# Patient Record
Sex: Male | Born: 1960
Health system: Southern US, Community
[De-identification: ages and names within clinical notes are randomized; demographics above are authoritative.]

## PROBLEM LIST (undated history)

## (undated) DIAGNOSIS — K219 Gastro-esophageal reflux disease without esophagitis: Secondary | ICD-10-CM

## (undated) DIAGNOSIS — Z87442 Personal history of urinary calculi: Secondary | ICD-10-CM

## (undated) DIAGNOSIS — F329 Major depressive disorder, single episode, unspecified: Secondary | ICD-10-CM

## (undated) DIAGNOSIS — F32A Depression, unspecified: Secondary | ICD-10-CM

---

## 2002-10-18 ENCOUNTER — Ambulatory Visit (HOSPITAL_COMMUNITY): Admission: RE | Admit: 2002-10-18 | Discharge: 2002-10-18 | Payer: Self-pay | Admitting: Family Medicine

## 2002-10-18 ENCOUNTER — Encounter: Payer: Self-pay | Admitting: Family Medicine

## 2007-08-24 ENCOUNTER — Ambulatory Visit (HOSPITAL_COMMUNITY): Admission: RE | Admit: 2007-08-24 | Discharge: 2007-08-24 | Payer: Self-pay | Admitting: Family Medicine

## 2008-07-14 HISTORY — PX: KNEE SURGERY: SHX244

## 2009-01-05 ENCOUNTER — Ambulatory Visit (HOSPITAL_COMMUNITY): Admission: RE | Admit: 2009-01-05 | Discharge: 2009-01-05 | Payer: Self-pay | Admitting: Family Medicine

## 2009-01-07 ENCOUNTER — Emergency Department (HOSPITAL_COMMUNITY): Admission: EM | Admit: 2009-01-07 | Discharge: 2009-01-07 | Payer: Self-pay | Admitting: Emergency Medicine

## 2009-01-08 ENCOUNTER — Ambulatory Visit: Payer: Self-pay | Admitting: Internal Medicine

## 2009-01-08 ENCOUNTER — Encounter: Payer: Self-pay | Admitting: Internal Medicine

## 2009-01-08 ENCOUNTER — Observation Stay (HOSPITAL_COMMUNITY): Admission: AD | Admit: 2009-01-08 | Discharge: 2009-01-11 | Payer: Self-pay | Admitting: Family Medicine

## 2009-01-08 ENCOUNTER — Telehealth (INDEPENDENT_AMBULATORY_CARE_PROVIDER_SITE_OTHER): Payer: Self-pay | Admitting: *Deleted

## 2009-01-09 ENCOUNTER — Ambulatory Visit: Payer: Self-pay | Admitting: Gastroenterology

## 2009-01-10 ENCOUNTER — Ambulatory Visit: Payer: Self-pay | Admitting: Internal Medicine

## 2009-01-19 ENCOUNTER — Encounter: Payer: Self-pay | Admitting: Internal Medicine

## 2010-05-28 ENCOUNTER — Ambulatory Visit: Payer: Self-pay | Admitting: Orthopedic Surgery

## 2010-05-28 DIAGNOSIS — M25569 Pain in unspecified knee: Secondary | ICD-10-CM | POA: Insufficient documentation

## 2010-05-28 DIAGNOSIS — M23329 Other meniscus derangements, posterior horn of medial meniscus, unspecified knee: Secondary | ICD-10-CM | POA: Insufficient documentation

## 2010-05-29 ENCOUNTER — Encounter: Payer: Self-pay | Admitting: Orthopedic Surgery

## 2010-05-31 ENCOUNTER — Encounter: Payer: Self-pay | Admitting: Orthopedic Surgery

## 2010-06-04 ENCOUNTER — Ambulatory Visit (HOSPITAL_COMMUNITY)
Admission: RE | Admit: 2010-06-04 | Discharge: 2010-06-04 | Payer: Self-pay | Source: Home / Self Care | Admitting: Orthopedic Surgery

## 2010-06-05 ENCOUNTER — Encounter: Payer: Self-pay | Admitting: Orthopedic Surgery

## 2010-06-18 ENCOUNTER — Ambulatory Visit: Payer: Self-pay | Admitting: Orthopedic Surgery

## 2010-06-18 DIAGNOSIS — M2350 Chronic instability of knee, unspecified knee: Secondary | ICD-10-CM | POA: Insufficient documentation

## 2010-06-18 DIAGNOSIS — M1710 Unilateral primary osteoarthritis, unspecified knee: Secondary | ICD-10-CM | POA: Insufficient documentation

## 2010-06-18 DIAGNOSIS — M2340 Loose body in knee, unspecified knee: Secondary | ICD-10-CM | POA: Insufficient documentation

## 2010-06-18 DIAGNOSIS — IMO0002 Reserved for concepts with insufficient information to code with codable children: Secondary | ICD-10-CM

## 2010-06-21 ENCOUNTER — Encounter (INDEPENDENT_AMBULATORY_CARE_PROVIDER_SITE_OTHER): Payer: Self-pay | Admitting: *Deleted

## 2010-06-26 ENCOUNTER — Telehealth: Payer: Self-pay | Admitting: Orthopedic Surgery

## 2010-07-01 ENCOUNTER — Ambulatory Visit (HOSPITAL_COMMUNITY)
Admission: RE | Admit: 2010-07-01 | Discharge: 2010-07-01 | Payer: Self-pay | Source: Home / Self Care | Attending: Orthopedic Surgery | Admitting: Orthopedic Surgery

## 2010-07-01 ENCOUNTER — Encounter: Payer: Self-pay | Admitting: Orthopedic Surgery

## 2010-07-03 ENCOUNTER — Ambulatory Visit: Payer: Self-pay | Admitting: Orthopedic Surgery

## 2010-07-03 ENCOUNTER — Telehealth: Payer: Self-pay | Admitting: Orthopedic Surgery

## 2010-07-03 DIAGNOSIS — Z9889 Other specified postprocedural states: Secondary | ICD-10-CM | POA: Insufficient documentation

## 2010-07-04 ENCOUNTER — Encounter (HOSPITAL_COMMUNITY)
Admission: RE | Admit: 2010-07-04 | Discharge: 2010-08-03 | Payer: Self-pay | Source: Home / Self Care | Attending: Orthopedic Surgery | Admitting: Orthopedic Surgery

## 2010-07-17 ENCOUNTER — Telehealth: Payer: Self-pay | Admitting: Orthopedic Surgery

## 2010-07-18 ENCOUNTER — Encounter: Payer: Self-pay | Admitting: Orthopedic Surgery

## 2010-07-24 ENCOUNTER — Ambulatory Visit
Admission: RE | Admit: 2010-07-24 | Discharge: 2010-07-24 | Payer: Self-pay | Source: Home / Self Care | Attending: Orthopedic Surgery | Admitting: Orthopedic Surgery

## 2010-07-24 ENCOUNTER — Encounter (INDEPENDENT_AMBULATORY_CARE_PROVIDER_SITE_OTHER): Payer: Self-pay | Admitting: *Deleted

## 2010-07-26 ENCOUNTER — Encounter: Payer: Self-pay | Admitting: Orthopedic Surgery

## 2010-08-13 NOTE — Assessment & Plan Note (Signed)
Summary: RT KNEE PAIN/NEEDS XRAY/BCBS/CAF   Vital Signs:  Patient profile:   50 year old male Height:      72 inches Weight:      208 pounds Pulse rate:   82 / minute Resp:     18 per minute  Vitals Entered By: Fuller Canada MD (May 28, 2010 2:14 PM)  Visit Type:  new patient Referring Serge Main:  self Primary Parks Czajkowski:  Dr. Lubertha South  CC:  right knee pain.  History of Present Illness: I saw Samuel Henry in the office today for an initial visit.  He is a 50 years old man with the complaint JY:NWGNF knee pain.  The patient was going up a step or stair and fell, acute onset of pain on the medial side of his knee associated with a burning sensation and since that time. He has sharp, dull, throbbing pain on the medial side of his RIGHT knee associated with a popping sensation in discomfort depending on which way in which position. His knees in. There are subtle signs of giving out. There is increased stiffness and aching with activity. He also has a lot of discomfort after sitting for a long time.  No injury.  Xrays today.  Meds: Bupropion sr, Omeprazole, Xanax.      Allergies (verified): 1)  ! Nsaids  Past History:  Past Medical History: acid reflux anxiety seasonal allergies  Past Surgical History: na  Family History: Family History Coronary Heart Disease male < 58  Social History: Patient is married.  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  Review of Systems Gastrointestinal:  Complains of heartburn; denies nausea, vomiting, diarrhea, constipation, and blood in your stools. Psychiatric:  Complains of anxiety; denies nervousness, depression, and hallucinations. Immunology:  Complains of seasonal allergies; denies sinus problems and allergic to bee stings.  The review of systems is negative for Constitutional, Cardiovascular, Respiratory, Genitourinary, Neurologic, Musculoskeletal, Endocrine, Skin, HEENT, and  Hemoatologic.  Physical Exam  Skin:  intact without lesions or rashes Psych:  alert and cooperative; normal mood and affect; normal attention span and concentration   Knee Exam  General:    Well-developed, well-nourished, normal body habitus; no deformities, normal grooming.  Gait:    Normal heel-toe gait pattern bilaterally.    Skin:    Intact, no scars, lesions, rashes, cafe au lait spots, or bruising.    Inspection:     No deformity, ecchymosis or swelling.   Palpation:      tenderness R-medial joint line.    Vascular:    There was no swelling or varicose veins. The pulses and temperature are normal. There was no edema or tenderness.  Sensory:    Gross coordination and sensation were normal.    Motor:    Motor strength 5/5 bilaterally for quadriceps, hamstrings, ankle dorsiflexion, and ankle plantar flexion.    Reflexes:    Normal and symmetric patellar and Achilles reflexes bilaterally.    Knee Exam:    Right:    Inspection:  Abnormal    Palpation:  Abnormal    Stability:  stable    Range of Motion:       Flexion-Active: full       Extension-Active: full    Left:    Inspection:  Normal    Palpation:  Normal    Range of Motion:       Flexion-Active: full       Extension-Active: full  Special tests:    McMurrays negative  Patellar mobility:    Right normal    Left normal Anterior drawer:    Right negative; Left negative Posterior drawer:    Right negative; Left negative Lachman :    Right negative; Left negative MCL:    Right negative; Left negative LCL:    Right negative; Left negative   Impression & Recommendations: 3 views of the RIGHT knee, show that there is a loose body in the center of the joint just behind the patellar fat pad. There is, otherwise only minimal degenerative changes with equal joint spaces. Some mild spurring at the superior pole of patella.  Recommend MRI to define and quantify the loose body and evaluate the medial  meniscus for possible tear MRI indications pain, for 6 months. Medial knee with a burning sensation with initial injury and popping. Loose body.  Other Orders: New Patient Level III ([address]) Knee x-ray,  3 views (60454)  Patient Instructions: 1)  We will call you with the results after the MRI   Orders Added: 1)  New Patient Level III [09811] 2)  Knee x-ray,  3 views [73562]

## 2010-08-13 NOTE — Miscellaneous (Signed)
Summary: MRI [facility] right knee 06/07/10 reg 830am  Clinical Lists Changes    no precert needed for [employer] PPO, LMOM for patient about appt, DR TO CALL WITH RESULTS.

## 2010-08-13 NOTE — Letter (Signed)
Summary: surgery order Rt knee sched 07/01/10  surgery order Rt knee sched 07/01/10   Imported By: Cammie Sickle 06/18/2010 18:41:11  _____________________________________________________________________  External Attachment:    Type:   Image     Comment:   External Document

## 2010-08-13 NOTE — Miscellaneous (Signed)
  probably  old acl   also has med men tear and med lateral arthritis  with loose fragment v  rec surgery arthroscopy   Clinical Lists Changes

## 2010-08-13 NOTE — Letter (Signed)
Summary: History form  History form   Imported By: Jacklynn Ganong 05/29/2010 08:40:21  _____________________________________________________________________  External Attachment:    Type:   Image     Comment:   External Document

## 2010-08-13 NOTE — Letter (Signed)
Summary: Out of Work  [facility]  [address]  [place], [place] [address]   Phone: [phone]  Fax: [phone]    June 18, 2010   Employee:  Samuel Henry    To Whom It May Concern:   For Medical reasons, please excuse the above named employee from work for the following dates:  Start:   07/01/10  Estimated end date:   07/17/10, or until further notice   If you need additional information, please feel free to contact our office.         Sincerely,    Terrance Mass, MD

## 2010-08-13 NOTE — Assessment & Plan Note (Signed)
Summary: to schedule surgery rt knee.[email or web address]   Visit Type:  Follow-up Referring Provider:  self Primary Provider:  Dr. Lubertha South  CC:  right knee pain.  History of Present Illness: I saw Samuel Henry in the office today for a visit.  He is a 50 years old man with the complaint NU:UVOZD knee pain.  The patient was going up a step or stair and fell, acute onset of pain on the medial side of his knee associated with a burning sensation and since that time. He has sharp, dull, throbbing pain on the medial side of his RIGHT knee associated with a popping sensation in discomfort depending on which way in which position. His knees in. There are subtle signs of giving out. There is increased stiffness and aching with activity. He also has a lot of discomfort after sitting for a long time.  Meds: Bupropion sr, Omeprazole, Xanax, Etodolac 400mg  as needed.  he will be scheduled for surgery today.         Allergies: 1)  ! Nsaids  Past History:  Past Medical History: Last updated: 05/28/2010 acid reflux anxiety seasonal allergies  Past Surgical History: Last updated: 05/28/2010 na  Family History: Last updated: 05/28/2010 Family History Coronary Heart Disease male < 67  Social History: Last updated: 05/28/2010 Patient is married.  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  Review of Systems      See HPI Constitutional:  Denies weight loss, weight gain, fever, chills, and fatigue. Cardiovascular:  Denies chest pain, palpitations, fainting, and murmurs. Respiratory:  Denies short of breath, wheezing, couch, tightness, pain on inspiration, and snoring . Gastrointestinal:  Denies heartburn, nausea, vomiting, diarrhea, constipation, and blood in your stools. Genitourinary:  Denies frequency, urgency, difficulty urinating, painful urination, flank pain, and bleeding in urine. Neurologic:  Denies numbness, tingling, unsteady gait, dizziness,  tremors, and seizure. Musculoskeletal:  See HPI. Endocrine:  Denies excessive thirst, exessive urination, and heat or cold intolerance. Psychiatric:  Denies nervousness, depression, anxiety, and hallucinations. Skin:  Denies changes in the skin, poor healing, rash, itching, and redness. HEENT:  Denies blurred or double vision, eye pain, redness, and watering. Immunology:  Denies seasonal allergies, sinus problems, and allergic to bee stings. Hemoatologic:  Denies easy bleeding and brusing.   Impression & Recommendations:  Problem # 1:  DERANGEMENT OF POSTERIOR HORN OF MEDIAL MENISCUS (ICD-717.2)  that Samuel Henry has decided to go ahead with surgical treatment.  Based on his history we think he had an old ACL injury and now has an acute medial meniscal injury with associated loose body which may be older  We will proceed with surgical treatment.  Risks and benefits have been discussed  The MRI showed the following  IMPRESSION: 1.  Small oblique grade 3 tear of the posterior horn of the medial meniscus extends to the inferior meniscal surface. 2.  Attenuated but not totally torn anterior cruciate ligament suggesting partial or interstitial tear. 3.  Mild pes anserine bursitis and trace knee effusion.   4.  Degenerative chondral thinning along the medial patellar facet with several subcortical lesions noted. 5.  Suspected free osteochondral fragment in the knee joint. 6.  Degenerative chondral findings in the medial and lateral compartments.  Orders: Est. Patient Level IV (66440)  Problem # 2:  LOOSE BODY-KNEE (ICD-717.6)  arthroscopy RIGHT knee partial medial meniscectomy, removal of loose body, debridement  Orders: Est. Patient Level IV (34742)  Problem # 3:  OLD DISRUPTION OF ANTERIOR  CRUCIATE LIGAMENT (ICD-717.83)  Orders: Est. Patient Level IV (57846)  Problem # 4:  ARTHRITIS, RIGHT KNEE (ICD-716.96)  Orders: Est. Patient Level IV (96295)  Patient  Instructions: 1)  DOS 07/01/10 2)  Post op 1 in our office on  07/03/10 3)  right knee. 4)  Arthroscopy right knee partial medial menisectomy  5)  I will call you today and let you know when to go to [facility] short stay for your preop, take packet with you.   Orders Added: 1)  Est. Patient Level IV [28413]

## 2010-08-13 NOTE — Miscellaneous (Signed)
Summary: No prior auth required for out-patient procedure  Clinical Lists Changes  Contacted insurer [employer], re: out-patient surgery schedule at [facility] 07/01/10, CPT [phone], [phone].  Per Gerilyn Nestle, no pre-auth required, her name as reference.

## 2010-08-15 NOTE — Miscellaneous (Signed)
Summary: PT clinical evaluation  PT clinical evaluation   Imported By: Jacklynn Ganong 07/18/2010 15:43:24  _____________________________________________________________________  External Attachment:    Type:   Image     Comment:   External Document

## 2010-08-15 NOTE — Letter (Signed)
Summary: [employer] disability form  [employer] disability form   Imported By: Cammie Sickle 08/01/2010 12:41:49  _____________________________________________________________________  External Attachment:    Type:   Image     Comment:   External Document

## 2010-08-15 NOTE — Letter (Signed)
Summary: Out of Work  [facility]  [address]  [place], [place] [address]   Phone: [phone]  Fax: [phone]    July 03, 2010   Employee:  Samuel Henry    To Whom It May Concern:   For Medical reasons, please excuse the above named employee from work for the following dates:  Start:   07/03/2010  Estimated end date:   07/24/2010 or until further notice   If you need additional information, please feel free to contact our office.         Sincerely,    Lenda Kelp  MD

## 2010-08-15 NOTE — Assessment & Plan Note (Signed)
Summary: surg rt knee 07-01-10/post op 1/bcbs/wkj   Visit Type:  Follow-up Referring Provider:  self Primary Provider:  Dr. Lubertha South  CC:  post op 1 right knee.  History of Present Illness: I saw Samuel Henry in the office today for a followup visit.  He is a 50 years old man with the complaint of:  post op 1 right knee arthroscopy, PMM, removal of loose body, chondroplasty medial femoral condyle.  DOS 07/01/10.  POD 2.    Meds: Norco 7.5 number 84 with 1 refill given.  W. today for postop check, doing well. Knee flexion, 90, portal sites look good. He does have some contact dermatitis from Xeroform. He is advised is allergic to that now.  Continue with physical therapy weightbearing as tolerated, crutches can be weaned as tolerated. Followup in 3 weeks  Allergies: 1)  ! Nsaids   Impression & Recommendations:  Problem # 1:  OLD DISRUPTION OF ANTERIOR CRUCIATE LIGAMENT (ICD-717.83)  Orders: Post-Op Check (09811)  Problem # 2:  LOOSE BODY-KNEE (ICD-717.6)  Orders: Post-Op Check (91478)  Problem # 3:  DERANGEMENT OF POSTERIOR HORN OF MEDIAL MENISCUS (ICD-717.2)  Orders: Post-Op Check (29562)  Problem # 4:  ARTHROSCOPY, RIGHT KNEE, HX OF (ICD-V45.89)  Orders: Post-Op Check (13086)  Patient Instructions: 1)  come back in 3 weeks   Orders Added: 1)  Post-Op Check [57846]

## 2010-08-15 NOTE — Progress Notes (Signed)
Summary: can he do exercises at home?  Phone Note Call from Patient   Summary of Call: Samuel Henry has completed 4 PT sessions at [facility].  He has found out that his insurance is only paying for sessions before 07/14/10 without starting over with a new deductible for this year.  He said the therapist has given him home exercises. Is it OK for him to complete his program at home?  Has an appointment with you next Wednesday. His # [phone] Initial call taken by: Jacklynn Ganong,  July 17, 2010 12:18 PM  Follow-up for Phone Call        yes  Follow-up by: Fuller Canada MD,  July 17, 2010 1:15 PM  Additional Follow-up for Phone Call Additional follow up Details #1::        Patient advised

## 2010-08-15 NOTE — Miscellaneous (Signed)
  Physical Exam  Skin:  intact without lesions or rashes Psych:  alert and cooperative; normal mood and affect; normal attention span and concentration   Knee Exam  General:    Well-developed, well-nourished, normal body habitus; no deformities, normal grooming.  Gait:    Normal heel-toe gait pattern bilaterally.    Skin:    Intact, no scars, lesions, rashes, cafe au lait spots, or bruising.    Inspection:     No deformity, ecchymosis or swelling.   Palpation:      tenderness R-medial joint line.    Vascular:    There was no swelling or varicose veins. The pulses and temperature are normal. There was no edema or tenderness.  Sensory:    Gross coordination and sensation were normal.    Motor:    Motor strength 5/5 bilaterally for quadriceps, hamstrings, ankle dorsiflexion, and ankle plantar flexion.    Reflexes:    Normal and symmetric patellar and Achilles reflexes bilaterally.    Knee Exam:    Right:    Inspection:  Abnormal    Palpation:  Abnormal    Stability:  stable    Range of Motion:       Flexion-Active: full       Extension-Active: full    Left:    Inspection:  Normal    Palpation:  Normal    Range of Motion:       Flexion-Active: full       Extension-Active: full  Special tests:    McMurrays negative   Patellar mobility:    Right normal    Left normal Anterior drawer:    Right negative; Left negative Posterior drawer:    Right negative; Left negative Lachman :    Right negative; Left negative MCL:    Right negative; Left negative LCL:    Right negative; Left negative   Impression & Recommendations: 3 views of the RIGHT knee, show that there is a loose body in the center of the joint just behind the patellar fat pad. There is, otherwise only minimal degenerative changes with equal joint spaces. Some mild spurring at the superior pole of patella.

## 2010-08-15 NOTE — Progress Notes (Signed)
Summary: patient asking about possible prescription  Phone Note Call from Patient   Caller: Patient Summary of Call: Patient called right back fol'g visit. Asking if something can be prescribed for constipation; said has not had a BM since surgery 07/01/10. celll [phone] Pharmacy is [facility] in [place] Initial call taken by: Cammie Sickle,  July 03, 2010 2:40 PM  Follow-up for Phone Call        magnesium citrate 30 cc q 6 hrs until bm   colace 100 mg two times a day # 60  Follow-up by: Fuller Canada MD,  July 03, 2010 2:44 PM  Additional Follow-up for Phone Call Additional follow up Details #1::        advised patient Additional Follow-up by: Ether Griffins,  July 03, 2010 3:51 PM

## 2010-08-15 NOTE — Progress Notes (Signed)
Summary: Call from [facility] day surgery w/results  Phone Note Other Incoming   Caller: Day surgery [facility] Summary of Call: French Ana from Day Surgery called [phone]) called to notify tested positive for MSSA and they are treating for staph w/Mupirocin. Initial call taken by: Cammie Sickle,  June 26, 2010 3:12 PM

## 2010-08-15 NOTE — Letter (Signed)
Summary: Out of Work  [facility]  [address]  [place], [place] [address]   Phone: [phone]  Fax: [phone]    July 24, 2010   Employee:  LAWSEN ARNOTT    To Whom It May Concern:   For Medical reasons, please excuse the above named employee from work for the following dates:  Start:   07/01/2010  End:/ 08/05/2010 can return to work for duty no restrictions     If you need additional information, please feel free to contact our office.         Sincerely,    Dr. Rudean Hitt, MD

## 2010-08-15 NOTE — Letter (Signed)
Summary: FMLA form [employer]  External Other   Imported By: Eugenio Hoes 07/17/2010 12:25:06  _____________________________________________________________________  External Attachment:    Type:   Image     Comment:   External Document

## 2010-08-15 NOTE — Assessment & Plan Note (Signed)
Summary: 3 WK SURG RT KNEE 07/01/10/ POST OP 2/BCBS/[employer]   Visit Type:  Follow-up Referring Provider:  self Primary Provider:  Dr. Lubertha South  CC:  post op rt knee.  History of Present Illness: I saw Samuel Henry in the office today for a followup visit.  He is a 50 years old man with the complaint of:  post op 2 right knee arthroscopy, PMM, removal of loose body, chondroplasty medial femoral condyle.  DOS 07/01/10.  Meds: Norco 7.5 not needed.  Today is 3 week followup after PT.  Doing better, still some aching after alot of activity.  Discharged from PT.  He is doing very well. His ACL tests clinically are normal. He did seem to have a partial ACL tear and arthroscopy. He does have some arthritis. His medial symptoms have resolved. He has excellent knee flexion, slight flexion contracture, which I think was noted. Preop.  He can go back to work on January 23 followup as needed      Allergies: 1)  ! Nsaids   Impression & Recommendations:  Problem # 1:  ARTHROSCOPY, RIGHT KNEE, HX OF (ICD-V45.89)  Orders: Post-Op Check ([address])  Problem # 2:  AFTERCARE FOLLOW SURGERY MUSCULOSKEL SYSTEM NEC (ICD-V58.78)  Orders: Post-Op Check (60454)  Problem # 3:  ARTHRITIS, RIGHT KNEE (ICD-716.96)  Orders: Post-Op Check (09811)  Problem # 4:  OLD DISRUPTION OF ANTERIOR CRUCIATE LIGAMENT (ICD-717.83)  Orders: Post-Op Check (91478)  Problem # 5:  LOOSE BODY-KNEE (ICD-717.6)  Orders: Post-Op Check (29562)  Problem # 6:  DERANGEMENT OF POSTERIOR HORN OF MEDIAL MENISCUS (ICD-717.2)  Orders: Post-Op Check (13086)  Patient Instructions: 1)  23rd Jan return to work  2)  f/u as needed    Orders Added: 1)  Post-Op Check [57846]

## 2010-09-24 LAB — BASIC METABOLIC PANEL
GFR calc Af Amer: >60 mL/min (ref >60)
GFR calc non Af Amer: >60 mL/min (ref >60)
Glucose, Bld: 88 mg/dL (ref 70–99)
Potassium: 3.9 mEq/L (ref 3.5–5.1)
Sodium: 141 mEq/L (ref 135–145)

## 2010-09-24 LAB — CBC
HCT: 39.8 % (ref 39.0–52.0)
Hemoglobin: 13.7 g/dL (ref 13.0–17.0)
MCHC: 34.4 g/dL (ref 30.0–36.0)
RBC: 4.67 MIL/uL (ref 4.22–5.81)
WBC: 5.4 10*3/uL (ref 4.0–10.5)

## 2010-09-24 LAB — SURGICAL PCR SCREEN: MRSA, PCR: NEGATIVE

## 2010-10-20 LAB — CBC
HCT: 31.8 % — ABNORMAL LOW (ref 39.0–52.0)
Hemoglobin: 11.5 g/dL — ABNORMAL LOW (ref 13.0–17.0)
RBC: 3.71 MIL/uL — ABNORMAL LOW (ref 4.22–5.81)
WBC: 6.3 10*3/uL (ref 4.0–10.5)

## 2010-10-20 LAB — DIFFERENTIAL
Eosinophils Relative: 1 % (ref 0–5)
Lymphocytes Relative: 15 % (ref 12–46)
Lymphs Abs: 0.9 10*3/uL (ref 0.7–4.0)
Monocytes Absolute: 0.5 10*3/uL (ref 0.1–1.0)
Monocytes Relative: 8 % (ref 3–12)

## 2010-10-21 LAB — DIFFERENTIAL
Basophils Absolute: 0 10*3/uL (ref 0.0–0.1)
Basophils Relative: 0 % (ref 0–1)
Basophils Relative: 0 % (ref 0–1)
Eosinophils Absolute: 0 10*3/uL (ref 0.0–0.7)
Eosinophils Absolute: 0 10*3/uL (ref 0.0–0.7)
Eosinophils Absolute: 0.1 10*3/uL (ref 0.0–0.7)
Eosinophils Relative: 0 % (ref 0–5)
Eosinophils Relative: 0 % (ref 0–5)
Lymphocytes Relative: 10 % — ABNORMAL LOW (ref 12–46)
Lymphs Abs: 0.7 10*3/uL (ref 0.7–4.0)
Lymphs Abs: 0.7 10*3/uL (ref 0.7–4.0)
Monocytes Absolute: 0.8 10*3/uL (ref 0.1–1.0)
Monocytes Relative: 11 % (ref 3–12)
Neutro Abs: 4.6 10*3/uL (ref 1.7–7.7)
Neutrophils Relative %: 77 % (ref 43–77)
Neutrophils Relative %: 78 % — ABNORMAL HIGH (ref 43–77)

## 2010-10-21 LAB — BASIC METABOLIC PANEL
BUN: 21 mg/dL (ref 6–23)
BUN: 5 mg/dL — ABNORMAL LOW (ref 6–23)
CO2: 26 mEq/L (ref 19–32)
CO2: 26 mEq/L (ref 19–32)
Calcium: 8.2 mg/dL — ABNORMAL LOW (ref 8.4–10.5)
Chloride: 105 mEq/L (ref 96–112)
Chloride: 109 mEq/L (ref 96–112)
Creatinine, Ser: 0.95 mg/dL (ref 0.4–1.5)
Creatinine, Ser: 0.97 mg/dL (ref 0.4–1.5)
Glucose, Bld: 89 mg/dL (ref 70–99)
Potassium: 3.7 mEq/L (ref 3.5–5.1)

## 2010-10-21 LAB — URINE MICROSCOPIC-ADD ON

## 2010-10-21 LAB — COMPREHENSIVE METABOLIC PANEL
ALT: 37 U/L (ref 0–53)
AST: 58 U/L — ABNORMAL HIGH (ref 0–37)
Albumin: 3.8 g/dL (ref 3.5–5.2)
CO2: 27 mEq/L (ref 19–32)
Calcium: 9 mg/dL (ref 8.4–10.5)
Creatinine, Ser: 1.18 mg/dL (ref 0.4–1.5)
GFR calc Af Amer: >60 mL/min (ref >60)
GFR calc non Af Amer: >60 mL/min (ref >60)
Sodium: 136 mEq/L (ref 135–145)

## 2010-10-21 LAB — CBC
HCT: 35.8 % — ABNORMAL LOW (ref 39.0–52.0)
MCHC: 35.6 g/dL (ref 30.0–36.0)
MCHC: 35.8 g/dL (ref 30.0–36.0)
MCHC: 36 g/dL (ref 30.0–36.0)
MCV: 86.1 fL (ref 78.0–100.0)
MCV: 86.1 fL (ref 78.0–100.0)
Platelets: 108 10*3/uL — ABNORMAL LOW (ref 150–400)
Platelets: 111 10*3/uL — ABNORMAL LOW (ref 150–400)
Platelets: 122 10*3/uL — ABNORMAL LOW (ref 150–400)
RBC: 4.8 MIL/uL (ref 4.22–5.81)
RDW: 12.4 % (ref 11.5–15.5)
WBC: 5.4 10*3/uL (ref 4.0–10.5)
WBC: 7.5 10*3/uL (ref 4.0–10.5)

## 2010-10-21 LAB — LIPASE, BLOOD: Lipase: 28 U/L (ref 11–59)

## 2010-10-21 LAB — HEPATIC FUNCTION PANEL
Albumin: 3.3 g/dL — ABNORMAL LOW (ref 3.5–5.2)
Alkaline Phosphatase: 69 U/L (ref 39–117)
Indirect Bilirubin: 0.8 mg/dL (ref 0.3–0.9)
Total Protein: 6.2 g/dL (ref 6.0–8.3)

## 2010-10-21 LAB — URINALYSIS, ROUTINE W REFLEX MICROSCOPIC
Glucose, UA: NEGATIVE mg/dL
Ketones, ur: 40 mg/dL — AB
Leukocytes, UA: NEGATIVE
Specific Gravity, Urine: 1.025 (ref 1.005–1.030)
pH: 6 (ref 5.0–8.0)

## 2010-10-21 LAB — KOH PREP: KOH Prep: NONE SEEN

## 2010-11-26 NOTE — Group Therapy Note (Signed)
NAMEHOSEY, BURMESTER                  ACCOUNT NO.:  [id number]   MEDICAL RECORD NO.:  [id number]          PATIENT TYPE:  INP   LOCATION:  A323                          FACILITY:  [facility]   PHYSICIAN:  W. Stephen Luking, M.D.DATE OF BIRTH:  1960-08-30   DATE OF PROCEDURE:  DATE OF DISCHARGE:                                 PROGRESS NOTE   SUBJECTIVE:  The patient states able to keep liquids down had been  easier, still having burning when he swallows, urinated well this  morning, less pain though still present, less nausea though still  present.   PHYSICAL EXAMINATION:  VITAL SIGNS:  Stable.  LUNGS:  Clear.  HEART:  Regular rate and rhythm.  ABDOMEN:  Minimal epigastric tenderness.   LABS:  CBC from admission, platelet count borderline well over 104,000,  hemoglobin and white blood count good, MET 7 good.   IMPRESSION:  1. Severe esophagitis, etiology unclear.  See prior notes.  2. Thrombocytopenia.   PLAN:  1. We will press on with Lovenox since over 100,000 and no active      bleeding.  2. Decrease IV fluids.  3. Maintain clear liquids.  4. Further recommendations based on Dr. Luvenia Starch results of biopsy.      Donna Bernard, M.D.  Electronically Signed     WSL/MEDQ  D:  01/09/2009  T:  01/10/2009  Job:  [id number]

## 2010-11-26 NOTE — Consult Note (Signed)
NAMEAYSEN, SHIEH                  ACCOUNT NO.:  [id number]   MEDICAL RECORD NO.:  [id number]          PATIENT TYPE:  OBV   LOCATION:  A323                          FACILITY:  [facility]   PHYSICIAN:  R. Roetta Sessions, M.D. DATE OF BIRTH:  05-25-1961   DATE OF CONSULTATION:  01/08/2009  DATE OF DISCHARGE:                                 CONSULTATION   REASON FOR CONSULTATION:  Crescendo odynophagia and dysphagia.   HISTORY OF PRESENT ILLNESS:  Mr. Laker Thompson. [place] is a very pleasant 61-  year-old Caucasian male who gives a long history (at least 10 years) of  typical gastroesophageal reflux disease he describes as heartburn, who  has had well-controlled symptoms on intermittent OTC H2 blocker therapy.   Approximately 5 days ago, when he was eating out at a [employer],  he consumed some beef and noted significant difficulties getting it to  go down.  He felt that the meat was getting caught behind his breast  bone.  He has had significant symptoms trying to get food to go down  ever since.  Also he has also had significant amount of pain associated  with swallowing.  Has been able to get down liquids fairly well but has  significant difficulty with solid food.   He did have symptoms to the point of trying to self-induce vomiting to  relieve what he perceived to be an obstruction without much improvement  in his symptoms.  Earlier last week he did take an aspirin powder for  headache.   He also was noted to have a mild leukocytosis and fever when he was seen  by Dr. Gerda Diss.  It is notable he was started on doxycycline over this  past weekend but this is at least 3 days after the onset the above-  mentioned symptoms.  He does not consume alcohol.  He does not use  tobacco products.  He gives a history of vague intermittent esophageal  dysphagia over several years for which she has not sought out  evaluation.  Has never had an EGD and has really not had any abdominal  pain and denies  melena, constipation, diarrhea or hematochezia.  There  is no family history of GI neoplasia.   He was seen in the ED over this past weekend at which time he underwent  laboratory evaluation which included a lipase which was normal at 28.  Amylase slightly elevated at 141.  CBC on June 27 and was 7.5, H and H  14.7 and 41.2.  Today's white count 5.4, H and H 12.9 and 35.8.  He was  noted to have a small amount of blood and protein in his urine on  January 27.  Had many bacteria, three to six RBCs and 0-2 white cells on  microscopic evaluation.  Chest x-ray on June 25 was read as being  normal.   PAST MEDICAL HISTORY:  Significant for anxiety, neurosis/depression,  gastroesophageal reflux disease.  He denies any prior surgeries.   MEDICATIONS ON ADMISSION:  1. Xanax 0.5 mg p.r.n.  2. Ranitidine HCl.  3. Prevacid.  4. Doxycycline.   ALLERGIES:  No known drug allergies.   FAMILY HISTORY:  Negative for GI or liver illness.   SOCIAL HISTORY:  Patient married.  He has 2 children grown in her 41s.  He lost one daughter in a motor vehicle accident.  No tobacco, no  alcohol.  Works as a Curator at [employer]  He lives in [place] with  his wife.   REVIEW OF SYSTEMS:  No dyspnea or chest pain on exertion.  He tells me  has lost 10 pounds over the past week because of the inability to eat.  Otherwise as in history of present illness.   PHYSICAL EXAMINATION:  CONSTITUTIONAL:  Reveals a very pleasant 50-year-  old gentleman resting comfortably.  HEENT:  Exam no scleral icterus.  Conjunctivae pink.  CHEST:  Lungs are clear to auscultation.  CARDIOVASCULAR:  Regular rate and rhythm without murmur, rub.  ABDOMEN:  Nondistended.  Positive bowel sounds, soft, entirely nontender  without appreciable mass or hepatosplenomegaly.  EXTREMITIES:  No edema.   LABORATORY EVALUATION:  Thus far as outlined above.   IMPRESSION:  Mr. Dino is a pleasant 50 year old gentleman with  longstanding  typical gastroesophageal reflux disease and vague  intermittent chronic symptoms of esophageal dysphagia who over the past  5 days has developed acutely worsening symptoms of esophageal dysphagia  as well as odynophagia.  His symptoms have progressed to the point where  to where he is now admitted for further evaluation of these symptoms.   At this point the differential diagnosis would include erosive reflux  esophagitis with stricture exacerbated by recent aspirin powder use.  Alternatively, the patient could have eosinophilic esophagitis with a  partial food impaction as well.   Pill induced esophageal injury in the setting of GERD with peptic  stricture or eosinophilic esophagitis could go along with the present  clinical picture.  With recent fever and leukocytosis, an infectious  process such as candida or herpes esophagitis could also present  similarly superimposed on gastroesophageal reflux disease although  certainly fever and leukocytosis could well be a separate issue.   He did appear to have some dirty urine.   I doubt that doxycycline precipitated this acute illness as the onset of  symptoms were again 3 days predating the first doxycycline intake,  although certainly doxycycline could be a contributing factor to ongoing  symptoms if he has an esophageal obstruction.   RECOMMENDATIONS:  Mr. Meunier needs an EGD as soon as can be arranged.  Risks, benefits and limitations to this approach have been reviewed.  This is the way ago.  This will give [place] much more information barium  pill esophagram at this time.  I told Mr. Trulson I may or may not be able  to dilate his esophagus today depending on what is found.  Potential for  esophageal brushings and biopsies also reviewed.  He is agreeable.  Plan  to perform EGD a little later this afternoon and then we will render  further recommendations.   I would thank Dr. Lubertha South for allowing me to see this nice  gentleman today.   Further recommendations to follow in the dictation.      Jonathon Bellows, M.D.  Electronically Signed     RMR/MEDQ  D:  01/08/2009  T:  01/08/2009  Job:  [id number]   cc:   Donna Bernard, M.D.  Fax: [phone]

## 2010-11-26 NOTE — H&P (Signed)
Samuel Henry, Samuel Henry                  ACCOUNT NO.:  [id number]   MEDICAL RECORD NO.:  [id number]          PATIENT TYPE:  OBV   LOCATION:  A323                          FACILITY:  [facility]   PHYSICIAN:  Donna Bernard, M.D.DATE OF BIRTH:  1961/03/05   DATE OF ADMISSION:  01/08/2009  DATE OF DISCHARGE:  LH                              HISTORY & PHYSICAL   CHIEF COMPLAINT:  Vomiting, chest pain, abdominal pain.   SUBJECTIVE:  This patient is a 50 year old white male with history of  chronic reflux, insomnia, remote history of kidney stones who presented  to the office mid last week with substernal burning.  He stated this  came on after having had some pot roast several days before he had a  sense it caught in his chest.  He has never seen in the office, had  substernal burning, and had several episodes of vomiting.  He had pretty  impressive temperature, had 102 temperature.  He was sent for lab work.  He had a white blood count mildly elevated at 13,000.  MET-7 was good.  Abdominal exam was completely benign.  He had a chest x-ray which was  negative.  The patient was advised to follow up the next day with Dr.  Milinda Cave in the office if he is still having problems.  He did not do  this.  By Sunday, his chest burning had worsened and he was vomiting  morning he had a distinct sensation that food and even drink was  catching as he tried to swallow.  He continued to have low-grade fever  intermittently.  He also had some congestion and drainage.  The patient  notes no headache.  He did have mild diffuse achiness.  No back pain.  He was given doxycycline in the office due to fever and achiness, low-  grade fever.   PAST MEDICAL HISTORY:  He has had prior medical history of kidney stones  remotely, intermittent hives.  He has had chronic reflux.  He uses  p.r.n. medication for this.   CURRENT MEDICATIONS:  1. Ranitidine 150 mg p.o. b.i.d. p.r.n.  2. Wellbutrin 150 SR one p.o. b.i.d.  3.  Xanax 0.5 mg on as needed basis.  4. Viagra 50 mg p.r.n.   PAST HOSPITALIZATIONS:  Way back in 1983, he had gastroenteritis,  dehydration.   FAMILY HISTORY:  Positive for diabetes, prostate cancer.   ALLERGIES:  None known.   SOCIAL HISTORY:  The patient works in maintenance.  He is married.  Does  have children.  No tobacco or alcohol use.   REVIEW OF SYSTEMS:  Otherwise negative.   PHYSICAL EXAMINATION:  VITAL SIGNS:  Temperature 98.1, BP 124/78.  GENERAL:  The patient is alert, in moderate distress.  HEENT: Mucous membranes are somewhat dry.  NECK:  Supple.  No lymphadenopathy.  LUNGS: Clear.  HEART:  Regular rate and rhythm.  Some epigastric tenderness.  No CVA  tenderness.  EXTREMITIES:  Normal.   SIGNIFICANT LABS:  CBC, MET-7, liver enzymes are pending.  Urinalysis  from before was negative for nitrites and leukocyte  esterase, also had  40 ketones   IMPRESSION:  Esophagitis, question mechanical obstruction such as a  Schatzki ring with history of chronic reflux and initiation with eating  meat.  The patient states intermittent solid food dysphagia in recent  months, febrile illness, throws things off a bit.  Advised family this  could be a couple of separate issues, although statistically would be in  better shape if we could pull it all under one diagnosis.   PLAN:  IV hydration, IV pain medicine, urgent blood work, GI consult.  I  spoke with Dr. Jena Gauss for doing upper endoscopy.  Further orders as noted  in the chart.      Donna Bernard, M.D.  Electronically Signed     WSL/MEDQ  D:  01/08/2009  T:  01/09/2009  Job:  [id number]

## 2010-11-26 NOTE — Op Note (Signed)
NAMESANDER, Samuel Henry                  ACCOUNT NO.:  [id number]   MEDICAL RECORD NO.:  [id number]          PATIENT TYPE:  OBV   LOCATION:  A323                          FACILITY:  [facility]   PHYSICIAN:  R. Roetta Sessions, M.D. DATE OF BIRTH:  1961/06/07   DATE OF PROCEDURE:  01/08/2009  DATE OF DISCHARGE:                               OPERATIVE REPORT   PROCEDURE:  Esophagogastroduodenoscopy with esophageal biopsy and  brushing.   INDICATIONS FOR PROCEDURE:  A 50 year old gentleman with chronic  gastroesophageal reflux disease now with a 1-week history of crescendo  odynophagia and dysphagia.  He also has had low grade fever and  leukocytosis.  EGD is now being done to further evaluate his symptoms.  Risks, benefits, alternatives and limitations have been reviewed and  questions answered.  Please see documentation in the medical record.   PROCEDURE NOTE:  O2 saturation, blood pressure, pulse respirations were  monitored throughout the entirety of procedure.   ANESTHESIA:  1. Conscious sedation with Versed 6 mg IV and Demerol 150 mg IV in      divided doses.  2. Cetacaine spray for topical pharyngeal anesthesia.   INSTRUMENT:  Pentax video chip system.   FINDINGS:  Examination of the tubular esophagus revealed markedly  abnormal mucosa with denuding of the esophageal mucosa diffusely with  islands of creamy yellow exudate, but not raised plaques that would  typically be seen with Candida.  There was no significant ulceration.  The esophagus was diffusely markedly inflamed.  There was a noncritical  Schatzki's ring.  The EG junction was easily traversed.  Stomach:  The  gastric cavity was emptied and insufflated well with air.  Thorough  examination of the gastric mucosa including retroflexion of the proximal  stomach and esophagogastric junction demonstrated a moderate size hiatal  hernia and multiple 1-2 mm fundal gland polyps which were not  manipulated.  The pylorus was patent,  easily traversed.  Examination of  the bulb and the second portion revealed no abnormalities.  Therapeutic/diagnostic maneuvers performed:  The esophageal mucosa was  brushed for KOH and subsequently histology.  Biopsies for histology were  taken.  This was done without difficulty with good patient tolerance.  The patient was reactive.   ENDOSCOPY IMPRESSION:  Markedly inflamed esophageal mucosa diffusely  with denuding of the mucosa and some creamy, yellowish fixed exudate,  atypical for Candida esophagitis.   The process suggests severe esophagitis status post biopsy and brushing,  noncritical Schatzki's ring, non-manipulative moderate size hiatal  hernia, fundal gland polyps, otherwise normal stomach, D1-D2.   The patient has severe esophagitis.  I would favor an infectious  etiology over a diffuse chemical (or medication) esophageal injury  although the two entities could theoretically co-exist.   RECOMMENDATIONS:  1. Continue acid suppression therapy as you are doing.  2. We will add Carafate 1 gram slurries q.i.d.  3. Put a rush on the path and the KOH prep.  4. Further recommendations to follow in the very near future.      Jonathon Bellows, M.D.  Electronically Signed  RMR/MEDQ  D:  01/08/2009  T:  01/08/2009  Job:  [id number]   cc:   Lesli Albee  Fax: [phone]

## 2011-02-03 ENCOUNTER — Emergency Department (HOSPITAL_COMMUNITY)
Admission: EM | Admit: 2011-02-03 | Discharge: 2011-02-03 | Disposition: A | Discharge status: Home / Self Care | Payer: BC Managed Care – PPO | Attending: Emergency Medicine | Admitting: Emergency Medicine

## 2011-02-03 ENCOUNTER — Emergency Department (HOSPITAL_COMMUNITY): Payer: BC Managed Care – PPO

## 2011-02-03 ENCOUNTER — Encounter: Payer: Self-pay | Admitting: Emergency Medicine

## 2011-02-03 DIAGNOSIS — F3289 Other specified depressive episodes: Secondary | ICD-10-CM | POA: Insufficient documentation

## 2011-02-03 DIAGNOSIS — Y92410 Unspecified street and highway as the place of occurrence of the external cause: Secondary | ICD-10-CM | POA: Insufficient documentation

## 2011-02-03 DIAGNOSIS — T148XXA Other injury of unspecified body region, initial encounter: Secondary | ICD-10-CM

## 2011-02-03 DIAGNOSIS — IMO0002 Reserved for concepts with insufficient information to code with codable children: Secondary | ICD-10-CM | POA: Insufficient documentation

## 2011-02-03 DIAGNOSIS — S20219A Contusion of unspecified front wall of thorax, initial encounter: Secondary | ICD-10-CM | POA: Insufficient documentation

## 2011-02-03 DIAGNOSIS — K219 Gastro-esophageal reflux disease without esophagitis: Secondary | ICD-10-CM | POA: Insufficient documentation

## 2011-02-03 DIAGNOSIS — F329 Major depressive disorder, single episode, unspecified: Secondary | ICD-10-CM | POA: Insufficient documentation

## 2011-02-03 HISTORY — DX: Major depressive disorder, single episode, unspecified: F32.9

## 2011-02-03 HISTORY — DX: Gastro-esophageal reflux disease without esophagitis: K21.9

## 2011-02-03 HISTORY — DX: Depression, unspecified: F32.A

## 2011-02-03 MED ORDER — IOHEXOL 300 MG/ML  SOLN
100.0000 mL | Freq: Once | INTRAMUSCULAR | Status: AC | PRN
  Administered 2011-02-03: 100 mL via INTRAVENOUS

## 2011-02-03 MED ORDER — ONDANSETRON HCL 4 MG/2ML IJ SOLN
4.0000 mg | Freq: Once | INTRAMUSCULAR | Status: AC
  Administered 2011-02-03: 4 mg via INTRAVENOUS
  Filled 2011-02-03: qty 2

## 2011-02-03 MED ORDER — FENTANYL CITRATE 0.05 MG/ML IJ SOLN
50.0000 ug | Freq: Once | INTRAMUSCULAR | Status: AC
  Administered 2011-02-03: 100 ug via INTRAVENOUS
  Filled 2011-02-03: qty 2

## 2011-02-03 MED ORDER — OXYCODONE-ACETAMINOPHEN 5-325 MG PO TABS: 2.0000 | ORAL_TABLET | Freq: Four times a day (QID) | ORAL | Status: AC | PRN

## 2011-02-03 MED ORDER — TETANUS-DIPHTH-ACELL PERTUSSIS 5-2.5-18.5 LF-MCG/0.5 IM SUSP
0.5000 mL | Freq: Once | INTRAMUSCULAR | Status: AC
  Administered 2011-02-03: 0.5 mL via INTRAMUSCULAR
  Filled 2011-02-03: qty 0.5

## 2011-02-03 MED ORDER — HYDROMORPHONE HCL 1 MG/ML IJ SOLN
1.0000 mg | Freq: Once | INTRAMUSCULAR | Status: AC
  Administered 2011-02-03: 1 mg via INTRAVENOUS
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
## 2011-02-06 ENCOUNTER — Encounter (HOSPITAL_COMMUNITY): Payer: Self-pay | Admitting: *Deleted

## 2011-02-06 ENCOUNTER — Emergency Department (HOSPITAL_COMMUNITY)
Admission: EM | Admit: 2011-02-06 | Discharge: 2011-02-06 | Disposition: A | Discharge status: Home / Self Care | Payer: BC Managed Care – PPO | Attending: Emergency Medicine | Admitting: Emergency Medicine

## 2011-02-06 ENCOUNTER — Emergency Department (HOSPITAL_COMMUNITY): Payer: BC Managed Care – PPO

## 2011-02-06 DIAGNOSIS — N200 Calculus of kidney: Secondary | ICD-10-CM | POA: Insufficient documentation

## 2011-02-06 DIAGNOSIS — K219 Gastro-esophageal reflux disease without esophagitis: Secondary | ICD-10-CM | POA: Insufficient documentation

## 2011-02-06 DIAGNOSIS — F3289 Other specified depressive episodes: Secondary | ICD-10-CM | POA: Insufficient documentation

## 2011-02-06 DIAGNOSIS — F329 Major depressive disorder, single episode, unspecified: Secondary | ICD-10-CM | POA: Insufficient documentation

## 2011-02-06 LAB — URINALYSIS, ROUTINE W REFLEX MICROSCOPIC
Bilirubin Urine: NEGATIVE
Leukocytes, UA: NEGATIVE
Nitrite: NEGATIVE
Specific Gravity, Urine: >1.03 — ABNORMAL HIGH (ref 1.005–1.030)
Urobilinogen, UA: 0.2 mg/dL (ref 0.0–1.0)

## 2011-02-06 LAB — COMPREHENSIVE METABOLIC PANEL
ALT: 22 U/L (ref 0–53)
Albumin: 3.5 g/dL (ref 3.5–5.2)
Alkaline Phosphatase: 83 U/L (ref 39–117)
Glucose, Bld: 106 mg/dL — ABNORMAL HIGH (ref 70–99)
Potassium: 3.8 mEq/L (ref 3.5–5.1)
Sodium: 141 mEq/L (ref 135–145)
Total Protein: 6.6 g/dL (ref 6.0–8.3)

## 2011-02-06 LAB — CBC
MCH: 29.6 pg (ref 26.0–34.0)
Platelets: 113 10*3/uL — ABNORMAL LOW (ref 150–400)
RBC: 4.09 MIL/uL — ABNORMAL LOW (ref 4.22–5.81)

## 2011-02-06 LAB — DIFFERENTIAL
Basophils Relative: 0 % (ref 0–1)
Eosinophils Absolute: 0 10*3/uL (ref 0.0–0.7)
Lymphs Abs: 0.3 10*3/uL — ABNORMAL LOW (ref 0.7–4.0)
Neutro Abs: 10.5 10*3/uL — ABNORMAL HIGH (ref 1.7–7.7)
Neutrophils Relative %: 93 % — ABNORMAL HIGH (ref 43–77)

## 2011-02-06 MED ORDER — SODIUM CHLORIDE 0.9 % IV SOLN
Freq: Once | INTRAVENOUS | Status: AC
  Administered 2011-02-06: 13:00:00 via INTRAVENOUS

## 2011-02-06 MED ORDER — KETOROLAC TROMETHAMINE 30 MG/ML IJ SOLN
30.0000 mg | Freq: Once | INTRAMUSCULAR | Status: AC
  Administered 2011-02-06: 30 mg via INTRAVENOUS
  Filled 2011-02-06: qty 1

## 2011-02-06 MED ORDER — MORPHINE SULFATE 4 MG/ML IJ SOLN
4.0000 mg | Freq: Once | INTRAMUSCULAR | Status: AC
  Administered 2011-02-06: 4 mg via INTRAVENOUS
  Filled 2011-02-06: qty 1

## 2011-02-06 MED ORDER — ONDANSETRON HCL 4 MG PO TABS: 4.0000 mg | ORAL_TABLET | Freq: Four times a day (QID) | ORAL | Status: AC

## 2011-02-06 MED ORDER — HYDROMORPHONE HCL 1 MG/ML IJ SOLN
1.0000 mg | Freq: Once | INTRAMUSCULAR | Status: AC
  Administered 2011-02-06: 1 mg via INTRAVENOUS
  Filled 2011-02-06: qty 1

## 2011-02-06 MED ORDER — TAMSULOSIN HCL 0.4 MG PO CAPS: 0.4000 mg | ORAL_CAPSULE | ORAL | Status: DC

## 2011-02-06 MED ORDER — ONDANSETRON HCL 4 MG/2ML IJ SOLN
4.0000 mg | Freq: Once | INTRAMUSCULAR | Status: AC
  Administered 2011-02-06: 4 mg via INTRAVENOUS
  Filled 2011-02-06: qty 2

## 2011-02-06 MED ORDER — OXYCODONE-ACETAMINOPHEN 5-325 MG PO TABS: 2.0000 | ORAL_TABLET | ORAL | Status: AC | PRN
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
## 2011-02-06 NOTE — ED Notes (Signed)
Reports onset of right flank pain radiating to right lower quadrant this am; c/o n/v with pain; hx of kidney stones

## 2011-08-26 ENCOUNTER — Emergency Department (HOSPITAL_COMMUNITY)
Admission: EM | Admit: 2011-08-26 | Discharge: 2011-08-26 | Disposition: A | Discharge status: Home / Self Care | Payer: BC Managed Care – PPO | Attending: Emergency Medicine | Admitting: Emergency Medicine

## 2011-08-26 ENCOUNTER — Encounter (HOSPITAL_COMMUNITY): Payer: Self-pay | Admitting: *Deleted

## 2011-08-26 DIAGNOSIS — F329 Major depressive disorder, single episode, unspecified: Secondary | ICD-10-CM | POA: Insufficient documentation

## 2011-08-26 DIAGNOSIS — K219 Gastro-esophageal reflux disease without esophagitis: Secondary | ICD-10-CM | POA: Insufficient documentation

## 2011-08-26 DIAGNOSIS — R1031 Right lower quadrant pain: Secondary | ICD-10-CM | POA: Insufficient documentation

## 2011-08-26 DIAGNOSIS — N23 Unspecified renal colic: Secondary | ICD-10-CM

## 2011-08-26 DIAGNOSIS — F3289 Other specified depressive episodes: Secondary | ICD-10-CM | POA: Insufficient documentation

## 2011-08-26 LAB — BASIC METABOLIC PANEL
BUN: 22 mg/dL (ref 6–23)
Chloride: 105 mEq/L (ref 96–112)
GFR calc Af Amer: >90 mL/min (ref >90)
Glucose, Bld: 109 mg/dL — ABNORMAL HIGH (ref 70–99)
Potassium: 3.9 mEq/L (ref 3.5–5.1)

## 2011-08-26 LAB — URINALYSIS, ROUTINE W REFLEX MICROSCOPIC
Glucose, UA: NEGATIVE mg/dL
Leukocytes, UA: NEGATIVE
Specific Gravity, Urine: >1.03 — ABNORMAL HIGH (ref 1.005–1.030)
pH: 6 (ref 5.0–8.0)

## 2011-08-26 LAB — CBC
HCT: 40.3 % (ref 39.0–52.0)
Hemoglobin: 14.2 g/dL (ref 13.0–17.0)
RDW: 12.2 % (ref 11.5–15.5)
WBC: 8.8 10*3/uL (ref 4.0–10.5)

## 2011-08-26 MED ORDER — HYDROMORPHONE HCL PF 2 MG/ML IJ SOLN
2.0000 mg | Freq: Once | INTRAMUSCULAR | Status: AC
  Administered 2011-08-26: 2 mg via INTRAVENOUS
  Filled 2011-08-26: qty 1

## 2011-08-26 MED ORDER — ONDANSETRON HCL 4 MG/2ML IJ SOLN
4.0000 mg | Freq: Once | INTRAMUSCULAR | Status: AC
  Administered 2011-08-26: 4 mg via INTRAVENOUS
  Filled 2011-08-26: qty 2

## 2011-08-26 MED ORDER — SODIUM CHLORIDE 0.9 % IV BOLUS (SEPSIS)
1000.0000 mL | Freq: Once | INTRAVENOUS | Status: AC
  Administered 2011-08-26: 1000 mL via INTRAVENOUS

## 2011-08-26 MED ORDER — TAMSULOSIN HCL 0.4 MG PO CAPS: 0.4000 mg | ORAL_CAPSULE | Freq: Every day | ORAL | Status: DC

## 2011-08-26 MED ORDER — OXYCODONE HCL 5 MG PO TABS: 5.0000 mg | ORAL_TABLET | ORAL | Status: AC | PRN

## 2011-08-26 MED ORDER — ONDANSETRON 8 MG PO TBDP: 8.0000 mg | ORAL_TABLET | Freq: Three times a day (TID) | ORAL | Status: AC | PRN

## 2011-08-26 NOTE — ED Notes (Signed)
Discharge instructions reviewed with pt, questions answered. Pt verbalized understanding.  
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
## 2011-08-26 NOTE — Discharge Instructions (Signed)
Ureteral Colic (Kidney Stones) Ureteral colic is the result of a condition when kidney stones form inside the kidney. Once kidney stones are formed they may move into the tube that connects the kidney with the bladder (ureter). If this occurs, this condition may cause pain (colic) in the ureter.  CAUSES  Pain is caused by stone movement in the ureter and the obstruction caused by the stone. SYMPTOMS  The pain comes and goes as the ureter contracts around the stone. The pain is usually intense, sharp, and stabbing in character. The location of the pain may move as the stone moves through the ureter. When the stone is near the kidney the pain is usually located in the back and radiates to the belly (abdomen). When the stone is ready to pass into the bladder the pain is often located in the lower abdomen on the side the stone is located. At this location, the symptoms may mimic those of a urinary tract infection with urinary frequency. Once the stone is located here it often passes into the bladder and the pain disappears completely. TREATMENT   Your caregiver will provide you with medicine for pain relief.   You may require specialized follow-up X-rays.   The absence of pain does not always mean that the stone has passed. It may have just stopped moving. If the urine remains completely obstructed, it can cause loss of kidney function or even complete destruction of the involved kidney. It is your responsibility and in your interest that X-rays and follow-ups as suggested by your caregiver are completed. Relief of pain without passage of the stone can be associated with severe damage to the kidney, including loss of kidney function on that side.   If your stone does not pass on its own, additional measures may be taken by your caregiver to ensure its removal.  HOME CARE INSTRUCTIONS   Increase your fluid intake. Water is the preferred fluid since juices containing vitamin C may acidify the urine making  it less likely for certain stones (uric acid stones) to pass.   Strain all urine. A strainer will be provided. Keep all particulate matter or stones for your caregiver to inspect.   Take your pain medicine as directed.   Make a follow-up appointment with your caregiver as directed.   Remember that the goal is passage of your stone. The absence of pain does not mean the stone is gone. Follow your caregiver's instructions.   Only take over-the-counter or prescription medicines for pain, discomfort, or fever as directed by your caregiver.  SEEK MEDICAL CARE IF:   Pain cannot be controlled with the prescribed medicine.   You have a fever.   Pain continues for longer than your caregiver advises it should.   There is a change in the pain, and you develop chest discomfort or constant abdominal pain.   You feel faint or pass out.  MAKE SURE YOU:   Understand these instructions.   Will watch your condition.   Will get help right away if you are not doing well or get worse.  Document Released: 04/09/2005 Document Revised: 03/12/2011 Document Reviewed: 12/25/2010 [employer] Patient Information 2012 [employer]. 

## 2011-08-26 NOTE — ED Notes (Signed)
C/o sudden onset of sharp, non-radiating RLQ pain associated with n/v at 0400 today; reports hx of kidney stones and states this pain is similar.

## 2012-03-02 ENCOUNTER — Ambulatory Visit (INDEPENDENT_AMBULATORY_CARE_PROVIDER_SITE_OTHER): Payer: BC Managed Care – PPO | Admitting: Urology

## 2012-03-02 DIAGNOSIS — N201 Calculus of ureter: Secondary | ICD-10-CM

## 2012-03-02 DIAGNOSIS — N4340 Spermatocele of epididymis, unspecified: Secondary | ICD-10-CM

## 2012-03-02 DIAGNOSIS — N200 Calculus of kidney: Secondary | ICD-10-CM
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
## 2012-10-01 ENCOUNTER — Encounter: Payer: Self-pay | Admitting: *Deleted

## 2012-10-01 DIAGNOSIS — K219 Gastro-esophageal reflux disease without esophagitis: Secondary | ICD-10-CM | POA: Insufficient documentation

## 2012-10-08 ENCOUNTER — Ambulatory Visit (INDEPENDENT_AMBULATORY_CARE_PROVIDER_SITE_OTHER): Payer: BC Managed Care – PPO | Admitting: Family Medicine

## 2012-10-08 ENCOUNTER — Encounter: Payer: Self-pay | Admitting: Family Medicine

## 2012-10-08 VITALS — BP 126/77 | HR 80 | Ht 73.0 in | Wt 209.0 lb

## 2012-10-08 DIAGNOSIS — G4700 Insomnia, unspecified: Secondary | ICD-10-CM

## 2012-10-08 DIAGNOSIS — K219 Gastro-esophageal reflux disease without esophagitis: Secondary | ICD-10-CM

## 2012-10-08 DIAGNOSIS — F411 Generalized anxiety disorder: Secondary | ICD-10-CM

## 2012-10-08 MED ORDER — OMEPRAZOLE 20 MG PO CPDR: 40.0000 mg | DELAYED_RELEASE_CAPSULE | Freq: Every day | ORAL | Status: DC

## 2012-10-08 MED ORDER — BUPROPION HCL ER (SR) 150 MG PO TB12: 150.0000 mg | ORAL_TABLET | Freq: Two times a day (BID) | ORAL | Status: DC

## 2012-10-08 MED ORDER — ALPRAZOLAM 0.5 MG PO TABS: ORAL_TABLET | ORAL | Status: DC

## 2012-10-08 NOTE — Progress Notes (Signed)
  Subjective:    Patient ID: Samuel Henry, male    DOB: [birth year], 52 y.o.   MRN: [id number]  Anxiety Symptoms occur occasionally. The severity of symptoms is moderate. The quality of sleep is fair. Nighttime awakenings: occasional.   Compliance with medications is 76-100%.  Gastrophageal Reflux He complains of abdominal pain, heartburn and a hoarse voice. This is a recurrent problem. The current episode started more than 1 year ago. The problem occurs occasionally. The problem has been unchanged. The heartburn duration is several minutes. The heartburn is located in the substernum. The heartburn is of moderate intensity. Nothing aggravates the symptoms. Associated symptoms include fatigue.      Review of Systems  Constitutional: Positive for fatigue.  HENT: Positive for hoarse voice.   Gastrointestinal: Positive for heartburn and abdominal pain.       Objective:   Physical Exam  Alert HEENT normal. Vitals reviewed. Lungs clear. Heart regular in rhythm. Abdomen soft no obvious epigastric tenderness ankles without edema.      Assessment & Plan:  Impression #1 reflux discussed. Patient definitely needs to daily on omeprazole. #2 insomnia stable. #3 chronic anxiety stable while taking meds. Plan diet exercise discussed. Meds reviewed. Recheck in 6 months for wellness exam. WSL

## 2013-01-12 ENCOUNTER — Encounter: Payer: Self-pay | Admitting: Orthopedic Surgery

## 2013-01-12 ENCOUNTER — Ambulatory Visit (INDEPENDENT_AMBULATORY_CARE_PROVIDER_SITE_OTHER): Payer: BC Managed Care – PPO

## 2013-01-12 ENCOUNTER — Ambulatory Visit (INDEPENDENT_AMBULATORY_CARE_PROVIDER_SITE_OTHER): Payer: BC Managed Care – PPO | Admitting: Orthopedic Surgery

## 2013-01-12 VITALS — BP 132/86 | Ht 72.0 in | Wt 205.0 lb

## 2013-01-12 DIAGNOSIS — S83242A Other tear of medial meniscus, current injury, left knee, initial encounter: Secondary | ICD-10-CM | POA: Insufficient documentation

## 2013-01-12 DIAGNOSIS — IMO0002 Reserved for concepts with insufficient information to code with codable children: Secondary | ICD-10-CM

## 2013-01-12 DIAGNOSIS — M25561 Pain in right knee: Secondary | ICD-10-CM

## 2013-01-12 DIAGNOSIS — M25569 Pain in unspecified knee: Secondary | ICD-10-CM

## 2013-01-12 NOTE — Progress Notes (Signed)
Patient ID: Samuel Henry, male   DOB: 1960/08/13, 52 y.o.   MRN: [id number] Chief Complaint  Patient presents with  . Knee Pain    Right knee pain    52 year old male 3 years post arthroscopy right knee at which time he had partial medial meniscectomy, removal of loose body and chondroplasty medial femoral condyle he also had a partial chronic anterior cruciate ligament tear which is felt to not need surgery at that time. He did very well up until Sunday, June 20 920 when across the street and felt immediate pain and a pop on the medial side of his left knee. Since that time has had throbbing pain 7/10 comes and goes worse when he stands or walks associated with swelling and mechanical symptoms of catching and locking. 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 I reviewed his old operative report and my old notes  VS BP 132/86  Ht 6' (1.829 m)  Wt 205 lb (92.987 kg)  BMI 27.8 kg/m2  General and hygiene are normal. Development and nutrition are normal. Body habitus medium Mood Affect are normal The patient is alert and oriented x3 Ambulatory status normal slight limp  RUE (include skin) Inspection reveals no tenderness or swelling,  range of motion is normal. The joints are located without subluxation or laxity. Motor exam reveals grade 5 strength and the skin is without rash lesion or ulcer  LUE Inspection reveals no tenderness or swelling,  range of motion is normal. The joints are located without subluxation or laxity. Motor exam reveals grade 5 strength;  skin is without rash lesion or ulcer  RLE Inspection reveals medial joint line tenderness with medium effusion;  range of  motion is abnormal with tightness felt that 120 of flexion. The joints are located without subluxation or laxity. Motor exam reveals grade 5 strength and the skin is without rash lesion or ulcer. He has a positive McMurray sign for medial meniscal tear  LLE Inspection reveals no tenderness or swelling,  range of motion is normal. The joints are located without subluxation or laxity. Motor exam reveals grade 5 strength and  the skin is without rash lesion or ulcer  CDV peripheral pulses are intact without swelling or varicose veins  LYMPH are normal in all 4 extremities with no palpable nodes  DTR are equal and symmetric Balance  is normal  X-rays show no significant findings to suggest arthritic disease  Repair medial meniscus  Recommend MRI to prepare for surgical arthroscopy and partial medial meniscectomy

## 2013-01-24 ENCOUNTER — Telehealth: Payer: Self-pay | Admitting: Radiology

## 2013-01-24 NOTE — Telephone Encounter (Signed)
Patient has an MRI at [facility] on 01-26-13 at 3:45. Patient has [employer], authorization # [id number] and it expires on 02-22-13. Patient will follow up back here in the office for his results.

## 2013-01-25 ENCOUNTER — Ambulatory Visit: Payer: BC Managed Care – PPO | Admitting: Orthopedic Surgery

## 2013-01-26 ENCOUNTER — Ambulatory Visit (HOSPITAL_COMMUNITY)
Admission: RE | Admit: 2013-01-26 | Discharge: 2013-01-26 | Disposition: A | Discharge status: Home / Self Care | Payer: BC Managed Care – PPO | Source: Ambulatory Visit | Attending: Orthopedic Surgery | Admitting: Orthopedic Surgery

## 2013-01-26 DIAGNOSIS — S83419A Sprain of medial collateral ligament of unspecified knee, initial encounter: Secondary | ICD-10-CM | POA: Insufficient documentation

## 2013-01-26 DIAGNOSIS — M25561 Pain in right knee: Secondary | ICD-10-CM

## 2013-01-26 DIAGNOSIS — M659 Unspecified synovitis and tenosynovitis, unspecified site: Secondary | ICD-10-CM | POA: Insufficient documentation

## 2013-01-26 DIAGNOSIS — M898X9 Other specified disorders of bone, unspecified site: Secondary | ICD-10-CM | POA: Insufficient documentation

## 2013-01-26 DIAGNOSIS — M25669 Stiffness of unspecified knee, not elsewhere classified: Secondary | ICD-10-CM | POA: Insufficient documentation

## 2013-01-26 DIAGNOSIS — M76899 Other specified enthesopathies of unspecified lower limb, excluding foot: Secondary | ICD-10-CM | POA: Insufficient documentation

## 2013-01-26 DIAGNOSIS — X58XXXA Exposure to other specified factors, initial encounter: Secondary | ICD-10-CM | POA: Insufficient documentation

## 2013-01-26 DIAGNOSIS — M25569 Pain in unspecified knee: Secondary | ICD-10-CM | POA: Insufficient documentation

## 2013-02-03 ENCOUNTER — Ambulatory Visit (INDEPENDENT_AMBULATORY_CARE_PROVIDER_SITE_OTHER): Payer: BC Managed Care – PPO | Admitting: Orthopedic Surgery

## 2013-02-03 ENCOUNTER — Encounter: Payer: Self-pay | Admitting: Orthopedic Surgery

## 2013-02-03 ENCOUNTER — Telehealth: Payer: Self-pay | Admitting: Orthopedic Surgery

## 2013-02-03 VITALS — BP 120/84 | Ht 72.0 in | Wt 205.0 lb

## 2013-02-03 DIAGNOSIS — M23329 Other meniscus derangements, posterior horn of medial meniscus, unspecified knee: Secondary | ICD-10-CM

## 2013-02-03 DIAGNOSIS — M1710 Unilateral primary osteoarthritis, unspecified knee: Secondary | ICD-10-CM

## 2013-02-03 DIAGNOSIS — M23321 Other meniscus derangements, posterior horn of medial meniscus, right knee: Secondary | ICD-10-CM

## 2013-02-03 NOTE — Telephone Encounter (Signed)
Lum Keas asked if he can get his knee surgery done on 03/21/13.  His # [phone]

## 2013-02-03 NOTE — Progress Notes (Signed)
Patient ID: Samuel Henry, male   DOB: Jul 10, 1961, 52 y.o.   MRN: [id number] Chief Complaint  Patient presents with  . Results    MRI  Results Right Knee   I seen the patient today for MRI of his right knee and followup. Review of history he has pain and mechanical symptoms in the right knee previous meniscectomy did well several years have passed. 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 If his meniscus is torn he'll have a dramatic change in his knee with some recurrent arthritic symptoms. If the meniscus turns out not to be torn his change in symptoms will be less dramatic  He still wants to proceed with surgery we discussed the risks and benefits of the surgery the complication risks he agreed to accept those risks in an attempt to get his knee better  He will call us back with a date some time August or early September for arthroscopic right knee with partial medial meniscectomy

## 2013-02-04 NOTE — Telephone Encounter (Signed)
Samuel Henry schedule a knee arthroscopy and medial menisectomy for torn meniscus

## 2013-02-07 ENCOUNTER — Other Ambulatory Visit: Payer: Self-pay | Admitting: *Deleted

## 2013-02-08 NOTE — Telephone Encounter (Signed)
Scheduled  Surgery for 03/21/13. Patient aware of dates and times of surgery, pre-op, and post op.

## 2013-02-09 ENCOUNTER — Telehealth: Payer: Self-pay | Admitting: Orthopedic Surgery

## 2013-02-09 NOTE — Telephone Encounter (Signed)
Per phone call to [place] and [employer] representative Silvio Clayman., no preauthorization required for outpatient CPT 29881/29880.

## 2013-02-18 ENCOUNTER — Other Ambulatory Visit (HOSPITAL_COMMUNITY): Payer: BC Managed Care – PPO

## 2013-03-08 ENCOUNTER — Encounter (HOSPITAL_COMMUNITY): Payer: Self-pay | Admitting: Pharmacy Technician

## 2013-03-15 ENCOUNTER — Other Ambulatory Visit (HOSPITAL_COMMUNITY): Payer: BC Managed Care – PPO

## 2013-03-16 ENCOUNTER — Encounter (HOSPITAL_COMMUNITY)
Admission: RE | Admit: 2013-03-16 | Discharge: 2013-03-16 | Disposition: A | Discharge status: Home / Self Care | Payer: BC Managed Care – PPO | Source: Ambulatory Visit | Attending: Orthopedic Surgery | Admitting: Orthopedic Surgery

## 2013-03-16 ENCOUNTER — Encounter (HOSPITAL_COMMUNITY): Payer: Self-pay

## 2013-03-16 DIAGNOSIS — Z01810 Encounter for preprocedural cardiovascular examination: Secondary | ICD-10-CM | POA: Insufficient documentation

## 2013-03-16 DIAGNOSIS — Z01812 Encounter for preprocedural laboratory examination: Secondary | ICD-10-CM | POA: Insufficient documentation

## 2013-03-16 HISTORY — DX: Personal history of urinary calculi: Z87.442

## 2013-03-16 LAB — BASIC METABOLIC PANEL
CO2: 24 mEq/L (ref 19–32)
Calcium: 9.1 mg/dL (ref 8.4–10.5)
Creatinine, Ser: 1.09 mg/dL (ref 0.50–1.35)
GFR calc non Af Amer: 77 mL/min — ABNORMAL LOW (ref >90)
Glucose, Bld: 92 mg/dL (ref 70–99)

## 2013-03-16 LAB — HEMOGLOBIN AND HEMATOCRIT, BLOOD: HCT: 39.9 % (ref 39.0–52.0)

## 2013-03-16 NOTE — Patient Instructions (Addendum)
ROMARI GASPARRO  03/16/2013   Your procedure is scheduled on:  03/21/2013  Report to [facility] at  1025  AM.  Call this number if you have problems the morning of surgery: [phone]   Remember:   Do not eat food or drink liquids after midnight.   Take these medicines the morning of surgery with A SIP OF WATER: xanax, wellbutrin, zyrtec, prilosec   Do not wear jewelry, make-up or nail polish.  Do not wear lotions, powders, or perfumes.   Do not shave 48 hours prior to surgery. Men may shave face and neck.  Do not bring valuables to the hospital.  [facility] is not responsible  for any belongings or valuables.  Contacts, dentures or bridgework may not be worn into surgery.  Leave suitcase in the car. After surgery it may be brought to your room.  For patients admitted to the hospital, checkout time is 11:00 AM the day of discharge.   Patients discharged the day of surgery will not be allowed to drive home.  Name and phone number of your driver: family  Special Instructions: Shower using CHG 2 nights before surgery and the night before surgery.  If you shower the day of surgery use CHG.  Use special wash - you have one bottle of CHG for all showers.  You should use approximately 1/3 of the bottle for each shower.   Please read over the following fact sheets that you were given: Pain Booklet, Coughing and Deep Breathing, Surgical Site Infection Prevention, Anesthesia Post-op Instructions and Care and Recovery After Surgery Arthroscopy Arthroscopy is a procedure in which a caregiver uses an arthroscope. An arthroscope is an instrument that allows your caregiver to look directly into a joint. It is like a small telescope attached to a video camera, and is similar in size to a pencil. Arthroscopes let your caregiver see inside your joint on an attached television monitor. Most joints in the human body can be examined and surgery can be performed through the arthroscope using small incisions.  Prior to the use of arthroscopes, surgeries were done with larger open incisions, which requires longer recovery times. On occasion, arthroscopic procedures result in complications such as bleeding, swelling and pain. If a complication results, a longer recovery and rehabilitation may be required. INDICATIONS Arthroscopic procedures were developed to remove, repair, or replace (reconstruct) damaged tissue. Arthroscopy can be preformed if the procedure involves trimming tissue, removing fragments of cartilage or bone (loose bodies) within joints, suctioning debris, biopsy of tissue, smoothing rough surfaces, removing inflamed tissue, shrinking tissue, or sewing (suturing), tacking, or stapling cartilage and ligaments. What can be done is dependent on many factors. Arthroscopy allows for surgeons to perform certain surgical procedures. Most of the surgeries you can go home the same day as the procedure (outpatient procedures) because the procedure does not cause as much trauma to the patient. Arthroscopy is a valuable diagnostic tool. Radiographs (such as x-ray and CT scans) have poor ability at showing soft tissue, whereas arthroscopy gives the caregiver direct visualization of soft tissue, cartilege, and bone. However, the emergence of magnetic resonance imaging (MRI) has lessened the need for arthroscopy as a diagnostic tool.  TECHNIQUE  Repair and reconstruction arthroscopic techniques may require additional and/or larger incisions than diagnostic arthroscopy portals (1/4 inch incisions). The procedures are often more extensive in repair and reconstruction, than excision procedures. Therefore, the patient may need to stay in the hospital overnight after arthroscopic repair or  reconstruction. These procedures also disrupt more tissue, and discomfort may occur, so the temporary use of braces, casts, or crutches, as well as rehabilitation, may be needed.  In order to undergo an arthroscopic procedure, a  complete evaluation is necessary in order to provide the caregiver with as accurate of a diagnosis as possible. Sometimes it is necessary to perform diagnostic arthroscopy before another surgery can be scheduled.  Both diagnostic and surgical arthroscopy can be performed under local anesthesia (only the joint is numbed), regional anesthesia (the operative limb is numbed), spinal or epidural anesthesia (only the lower extremities are numbed), or general anesthesia (you are completely asleep). The type of anesthetic is dependent on the patient, the surgeon, and the procedure being performed.  If you ask prior to the operation, you may be able to obtain pictures or a video from the arthroscopic camera.  Do not eat or drink anything for at least 8 hours before surgery. Food and drinks (including coffee) make general anesthesia more hazardous. SEEK MEDICAL CARE IF:  You experience pain, numbness, or coldness in the extremity operated on.  Blue, gray, or dark color appears in the fingers or toenails.  You have increased pain, swelling, redness, drainage, or bleeding in the surgical area despite rest, ice, elevation, and pain medications.  You have signs of infection, including a fever 102 F (38.9 C) or higher. Document Released: 01/29/2005 Document Revised: 09/22/2011 Document Reviewed: 10/12/2008 [facility] Patient Information 2014 [place], [place]. PATIENT INSTRUCTIONS POST-ANESTHESIA  IMMEDIATELY FOLLOWING SURGERY:  Do not drive or operate machinery for the first twenty four hours after surgery.  Do not make any important decisions for twenty four hours after surgery or while taking narcotic pain medications or sedatives.  If you develop intractable nausea and vomiting or a severe headache please notify your doctor immediately.  FOLLOW-UP:  Please make an appointment with your surgeon as instructed. You do not need to follow up with anesthesia unless specifically instructed to do so.  WOUND CARE  INSTRUCTIONS (if applicable):  Keep a dry clean dressing on the anesthesia/puncture wound site if there is drainage.  Once the wound has quit draining you may leave it open to air.  Generally you should leave the bandage intact for twenty four hours unless there is drainage.  If the epidural site drains for more than 36-48 hours please call the anesthesia department.  QUESTIONS?:  Please feel free to call your physician or the hospital operator if you have any questions, and they will be happy to assist you.

## 2013-03-20 NOTE — H&P (Signed)
  Chief Complaint   Patient presents with   .  Knee Pain       Right knee pain     52 year old male 3 years post arthroscopy right knee at which time he had partial medial meniscectomy, removal of loose body and chondroplasty medial femoral condyle he also had a partial chronic anterior cruciate ligament tear which is felt to not need surgery at that time. He did very well up until Sunday, December 31, 2012 when across the street and felt immediate pain and a pop on the medial side of his left knee. Since that time has had throbbing pain 7/10 comes and goes worse when he stands or walks associated with swelling and mechanical symptoms of catching and locking. 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 I reviewed his old operative report and my old notes  VS BP 132/86  Ht 6' (1.829 m)  Wt 205 lb (92.987 kg)  BMI 27.8 kg/m2  General and hygiene are normal. Development and nutrition are normal. Body habitus medium Mood Affect are normal The patient is alert and oriented x3 Ambulatory status normal slight limp  RUE (include skin) Inspection reveals no tenderness or swelling,  range of motion is normal. The joints are located without subluxation or laxity. Motor exam reveals grade 5 strength and the skin is without rash lesion or ulcer  LUE Inspection reveals no tenderness or swelling,  range of motion is normal. The joints are located without subluxation or laxity. Motor exam reveals grade 5 strength;  skin is without rash lesion or ulcer  RLE Inspection reveals medial joint line tenderness with medium effusion;  range of motion is abnormal with tightness felt that 120 of flexion. The  joints are located without subluxation or laxity. Motor exam reveals grade 5 strength and the skin is without rash lesion or ulcer. He has a positive McMurray sign for medial meniscal tear  LLE Inspection reveals no tenderness or swelling,  range of motion is normal. The joints are located without subluxation or laxity. Motor exam reveals grade 5 strength and  the skin is without rash lesion or ulcer  CDV peripheral pulses are intact without swelling or varicose veins  LYMPH are normal in all 4 extremities with no palpable nodes  DTR are equal and symmetric Balance  is normal  X-rays show no significant findings to suggest arthritic disease IMPRESSION:   1.  Free osteochondral fragments in the proximal posterolateral knee joint and also along the posterior margin of Hoffa's fat pad. 2.  Prominent grade 3 radial and longitudinal signal in the posterior horn medial meniscus, favoring a recurrent tear over postoperative meniscal signal. 3.  Grade 1 MCL sprain. 4.  Mild pes anserine bursitis.   5.  Full-thickness chondral thinning superiorly along the medial patellar facet and posterior patellar ridge, with a small adjacent degenerative subcortical cystic lesion along the posterior patellar ridge. 6.  Moderate chondral thinning and mild chondral irregularity in the medial compartment.  Mild tri-compartmental spurring. 7.  Moderate knee effusion with mild synovitis.   PLAN: SARK WITH REPEAT MEDIAL MENISECTOMY (MAY NEED CHONDROPLASTY AS WELL)

## 2013-03-21 ENCOUNTER — Encounter (HOSPITAL_COMMUNITY)
Admission: RE | Disposition: A | Discharge status: Home / Self Care | Payer: BC Managed Care – PPO | Source: Ambulatory Visit | Attending: Orthopedic Surgery

## 2013-03-21 ENCOUNTER — Encounter (HOSPITAL_COMMUNITY): Payer: Self-pay | Admitting: *Deleted

## 2013-03-21 ENCOUNTER — Ambulatory Visit (HOSPITAL_COMMUNITY)
Admission: RE | Admit: 2013-03-21 | Discharge: 2013-03-21 | Disposition: A | Discharge status: Home / Self Care | Payer: BC Managed Care – PPO | Source: Ambulatory Visit | Attending: Orthopedic Surgery | Admitting: Orthopedic Surgery

## 2013-03-21 ENCOUNTER — Ambulatory Visit (HOSPITAL_COMMUNITY): Payer: BC Managed Care – PPO | Admitting: Anesthesiology

## 2013-03-21 ENCOUNTER — Encounter (HOSPITAL_COMMUNITY): Payer: Self-pay | Admitting: Anesthesiology

## 2013-03-21 DIAGNOSIS — M1710 Unilateral primary osteoarthritis, unspecified knee: Secondary | ICD-10-CM

## 2013-03-21 DIAGNOSIS — M23321 Other meniscus derangements, posterior horn of medial meniscus, right knee: Secondary | ICD-10-CM

## 2013-03-21 DIAGNOSIS — M23329 Other meniscus derangements, posterior horn of medial meniscus, unspecified knee: Secondary | ICD-10-CM

## 2013-03-21 DIAGNOSIS — IMO0002 Reserved for concepts with insufficient information to code with codable children: Secondary | ICD-10-CM | POA: Insufficient documentation

## 2013-03-21 HISTORY — PX: CHONDROPLASTY: SHX5177

## 2013-03-21 HISTORY — PX: KNEE ARTHROSCOPY WITH MEDIAL MENISECTOMY: SHX5651

## 2013-03-21 SURGERY — ARTHROSCOPY, KNEE, WITH MEDIAL MENISCECTOMY
Anesthesia: General | Site: Knee | Laterality: Right | Wound class: Clean

## 2013-03-21 MED ORDER — FENTANYL CITRATE 0.05 MG/ML IJ SOLN
25.0000 ug | INTRAMUSCULAR | Status: DC | PRN
  Administered 2013-03-21: 50 ug via INTRAVENOUS

## 2013-03-21 MED ORDER — FENTANYL CITRATE 0.05 MG/ML IJ SOLN
INTRAMUSCULAR | Status: AC
  Filled 2013-03-21: qty 2

## 2013-03-21 MED ORDER — PROPOFOL 10 MG/ML IV EMUL
INTRAVENOUS | Status: AC
  Filled 2013-03-21: qty 20

## 2013-03-21 MED ORDER — FENTANYL CITRATE 0.05 MG/ML IJ SOLN
INTRAMUSCULAR | Status: AC
  Administered 2013-03-21: 50 ug via INTRAVENOUS
  Filled 2013-03-21: qty 2

## 2013-03-21 MED ORDER — GLYCOPYRROLATE 0.2 MG/ML IJ SOLN
INTRAMUSCULAR | Status: AC
  Filled 2013-03-21: qty 2

## 2013-03-21 MED ORDER — LACTATED RINGERS IV SOLN
INTRAVENOUS | Status: DC
  Administered 2013-03-21: 12:00:00 via INTRAVENOUS

## 2013-03-21 MED ORDER — KETOROLAC TROMETHAMINE 30 MG/ML IJ SOLN
30.0000 mg | Freq: Once | INTRAMUSCULAR | Status: AC
  Administered 2013-03-21: 30 mg via INTRAVENOUS

## 2013-03-21 MED ORDER — BUPIVACAINE-EPINEPHRINE PF 0.5-1:200000 % IJ SOLN
INTRAMUSCULAR | Status: AC
  Filled 2013-03-21: qty 30

## 2013-03-21 MED ORDER — DEXAMETHASONE SODIUM PHOSPHATE 4 MG/ML IJ SOLN
4.0000 mg | Freq: Once | INTRAMUSCULAR | Status: AC
  Administered 2013-03-21: 4 mg via INTRAVENOUS

## 2013-03-21 MED ORDER — NEOSTIGMINE METHYLSULFATE 1 MG/ML IJ SOLN
INTRAMUSCULAR | Status: DC | PRN
  Administered 2013-03-21: 2 mg via INTRAVENOUS

## 2013-03-21 MED ORDER — CHLORHEXIDINE GLUCONATE 4 % EX LIQD: 60.0000 mL | Freq: Once | CUTANEOUS | Status: DC

## 2013-03-21 MED ORDER — PROPOFOL 10 MG/ML IV BOLUS
INTRAVENOUS | Status: DC | PRN
  Administered 2013-03-21: 20 mg via INTRAVENOUS
  Administered 2013-03-21: 160 mg via INTRAVENOUS

## 2013-03-21 MED ORDER — EPINEPHRINE HCL 1 MG/ML IJ SOLN
INTRAMUSCULAR | Status: AC
  Filled 2013-03-21: qty 5

## 2013-03-21 MED ORDER — HYDROCODONE-ACETAMINOPHEN 5-325 MG PO TABS
1.0000 | ORAL_TABLET | Freq: Once | ORAL | Status: AC
  Administered 2013-03-21: 1 via ORAL

## 2013-03-21 MED ORDER — BUPIVACAINE-EPINEPHRINE PF 0.5-1:200000 % IJ SOLN
INTRAMUSCULAR | Status: DC | PRN
  Administered 2013-03-21: 30 mL

## 2013-03-21 MED ORDER — FENTANYL CITRATE 0.05 MG/ML IJ SOLN
INTRAMUSCULAR | Status: DC | PRN
  Administered 2013-03-21: 100 ug via INTRAVENOUS

## 2013-03-21 MED ORDER — ONDANSETRON HCL 4 MG/2ML IJ SOLN
4.0000 mg | Freq: Once | INTRAMUSCULAR | Status: AC
  Administered 2013-03-21: 4 mg via INTRAVENOUS

## 2013-03-21 MED ORDER — MIDAZOLAM HCL 2 MG/2ML IJ SOLN
INTRAMUSCULAR | Status: AC
  Filled 2013-03-21: qty 2

## 2013-03-21 MED ORDER — ROCURONIUM BROMIDE 100 MG/10ML IV SOLN
INTRAVENOUS | Status: DC | PRN
  Administered 2013-03-21: 40 mg via INTRAVENOUS

## 2013-03-21 MED ORDER — CEFAZOLIN SODIUM-DEXTROSE 2-3 GM-% IV SOLR
2.0000 g | INTRAVENOUS | Status: AC
  Administered 2013-03-21: 2 g via INTRAVENOUS

## 2013-03-21 MED ORDER — ONDANSETRON HCL 4 MG/2ML IJ SOLN: 4.0000 mg | Freq: Once | INTRAMUSCULAR | Status: DC | PRN

## 2013-03-21 MED ORDER — KETOROLAC TROMETHAMINE 30 MG/ML IJ SOLN
INTRAMUSCULAR | Status: AC
  Filled 2013-03-21: qty 1

## 2013-03-21 MED ORDER — DEXAMETHASONE SODIUM PHOSPHATE 4 MG/ML IJ SOLN
INTRAMUSCULAR | Status: AC
  Filled 2013-03-21: qty 1

## 2013-03-21 MED ORDER — SODIUM CHLORIDE 0.9 % IR SOLN
Status: DC | PRN
  Administered 2013-03-21: 1000 mL

## 2013-03-21 MED ORDER — ONDANSETRON HCL 4 MG/2ML IJ SOLN
INTRAMUSCULAR | Status: AC
  Filled 2013-03-21: qty 2

## 2013-03-21 MED ORDER — ROCURONIUM BROMIDE 50 MG/5ML IV SOLN
INTRAVENOUS | Status: AC
  Filled 2013-03-21: qty 1

## 2013-03-21 MED ORDER — MIDAZOLAM HCL 2 MG/2ML IJ SOLN
1.0000 mg | INTRAMUSCULAR | Status: AC | PRN
  Administered 2013-03-21 (×3): 2 mg via INTRAVENOUS

## 2013-03-21 MED ORDER — SODIUM CHLORIDE 0.9 % IR SOLN
Status: DC | PRN
  Administered 2013-03-21 (×4)

## 2013-03-21 MED ORDER — GLYCOPYRROLATE 0.2 MG/ML IJ SOLN
INTRAMUSCULAR | Status: DC | PRN
  Administered 2013-03-21: 0.4 mg via INTRAVENOUS

## 2013-03-21 MED ORDER — HYDROCODONE-ACETAMINOPHEN 7.5-325 MG PO TABS: 1.0000 | ORAL_TABLET | ORAL | Status: DC | PRN

## 2013-03-21 MED ORDER — LIDOCAINE HCL (CARDIAC) 10 MG/ML IV SOLN
INTRAVENOUS | Status: DC | PRN
  Administered 2013-03-21: 20 mg via INTRAVENOUS

## 2013-03-21 MED ORDER — HYDROCODONE-ACETAMINOPHEN 5-325 MG PO TABS
ORAL_TABLET | ORAL | Status: AC
  Filled 2013-03-21: qty 1

## 2013-03-21 MED ORDER — PROMETHAZINE HCL 12.5 MG PO TABS: 12.5000 mg | ORAL_TABLET | Freq: Four times a day (QID) | ORAL | Status: DC | PRN

## 2013-03-21 MED ORDER — DEXAMETHASONE SODIUM PHOSPHATE 10 MG/ML IJ SOLN
INTRAMUSCULAR | Status: DC | PRN
  Administered 2013-03-21: 4 mg via INTRAVENOUS

## 2013-03-21 MED ORDER — CEFAZOLIN SODIUM-DEXTROSE 2-3 GM-% IV SOLR
INTRAVENOUS | Status: AC
  Filled 2013-03-21: qty 50

## 2013-03-21 SURGICAL SUPPLY — 47 items
BAG HAMPER (MISCELLANEOUS) ×2 IMPLANT
BANDAGE ELASTIC 6 VELCRO NS (GAUZE/BANDAGES/DRESSINGS) ×2 IMPLANT
BLADE AGGRESSIVE PLUS 4.0 (BLADE) ×2 IMPLANT
BLADE SURG SZ11 CARB STEEL (BLADE) ×2 IMPLANT
CHLORAPREP W/TINT 26ML (MISCELLANEOUS) ×2 IMPLANT
CLOTH BEACON ORANGE TIMEOUT ST (SAFETY) ×2 IMPLANT
COOLER CRYO IC GRAV AND TUBE (ORTHOPEDIC SUPPLIES) ×2 IMPLANT
CUFF CRYO KNEE18X23 MED (MISCELLANEOUS) ×2 IMPLANT
CUFF TOURNIQUET SINGLE 34IN LL (TOURNIQUET CUFF) ×2 IMPLANT
DECANTER SPIKE VIAL GLASS SM (MISCELLANEOUS) ×4 IMPLANT
EPINEPHRINE 1:1000 IMPLANT
GAUZE SPONGE 4X4 16PLY XRAY LF (GAUZE/BANDAGES/DRESSINGS) ×2 IMPLANT
GAUZE XEROFORM 5X9 LF (GAUZE/BANDAGES/DRESSINGS) ×2 IMPLANT
GLOVE BIOGEL PI IND STRL 6.5 (GLOVE) ×1 IMPLANT
GLOVE BIOGEL PI IND STRL 7.0 (GLOVE) ×2 IMPLANT
GLOVE BIOGEL PI INDICATOR 6.5 (GLOVE) ×1
GLOVE BIOGEL PI INDICATOR 7.0 (GLOVE) ×2
GLOVE SKINSENSE NS SZ8.0 LF (GLOVE) ×1
GLOVE SKINSENSE STRL SZ8.0 LF (GLOVE) ×1 IMPLANT
GLOVE SS BIOGEL STRL SZ 6.5 (GLOVE) ×1 IMPLANT
GLOVE SS N UNI LF 8.5 STRL (GLOVE) ×2 IMPLANT
GLOVE SUPERSENSE BIOGEL SZ 6.5 (GLOVE) ×1
GOWN STRL REIN XL XLG (GOWN DISPOSABLE) ×4 IMPLANT
HLDR LEG FOAM (MISCELLANEOUS) ×1 IMPLANT
IV NS IRRIG 3000ML ARTHROMATIC (IV SOLUTION) ×4 IMPLANT
KIT BLADEGUARD II DBL (SET/KITS/TRAYS/PACK) ×2 IMPLANT
KIT ROOM TURNOVER AP CYSTO (KITS) ×2 IMPLANT
LEG HOLDER FOAM (MISCELLANEOUS) ×1
MANIFOLD NEPTUNE II (INSTRUMENTS) ×2 IMPLANT
MARKER SKIN DUAL TIP RULER LAB (MISCELLANEOUS) ×2 IMPLANT
NEEDLE HYPO 18GX1.5 BLUNT FILL (NEEDLE) ×2 IMPLANT
NEEDLE HYPO 21X1.5 SAFETY (NEEDLE) ×2 IMPLANT
NEEDLE SPNL 18GX3.5 QUINCKE PK (NEEDLE) ×2 IMPLANT
NS IRRIG 1000ML POUR BTL (IV SOLUTION) ×2 IMPLANT
PACK ARTHRO LIMB DRAPE STRL (MISCELLANEOUS) ×2 IMPLANT
PAD ABD 5X9 TENDERSORB (GAUZE/BANDAGES/DRESSINGS) ×2 IMPLANT
PAD ARMBOARD 7.5X6 YLW CONV (MISCELLANEOUS) ×2 IMPLANT
PADDING CAST COTTON 6X4 STRL (CAST SUPPLIES) ×2 IMPLANT
SET ARTHROSCOPY INST (INSTRUMENTS) ×2 IMPLANT
SET ARTHROSCOPY PUMP TUBE (IRRIGATION / IRRIGATOR) ×2 IMPLANT
SET BASIN LINEN APH (SET/KITS/TRAYS/PACK) ×2 IMPLANT
SPONGE GAUZE 4X4 12PLY (GAUZE/BANDAGES/DRESSINGS) ×2 IMPLANT
SUT ETHILON 3 0 FSL (SUTURE) ×2 IMPLANT
SYR 30ML LL (SYRINGE) ×2 IMPLANT
SYRINGE 10CC LL (SYRINGE) ×2 IMPLANT
WAND 50 DEG COVAC W/CORD (SURGICAL WAND) ×2 IMPLANT
YANKAUER SUCT BULB TIP 10FT TU (MISCELLANEOUS) ×8 IMPLANT

## 2013-03-21 NOTE — Anesthesia Postprocedure Evaluation (Signed)
Anesthesia Post Note  Patient: Samuel Henry  Procedure(s) Performed: Procedure(s) (LRB): KNEE ARTHROSCOPY WITH MEDIAL MENISECTOMY (Right)  Anesthesia type: General  Patient location: PACU  Post pain: Pain level controlled  Post assessment: Post-op Vital signs reviewed, Patient's Cardiovascular Status Stable, Respiratory Function Stable, Patent Airway, No signs of Nausea or vomiting and Pain level controlled  Last Vitals:  Filed Vitals:   03/21/13 1414  BP: 93/47  Pulse: 56  Temp: 36.4 C  Resp: 18    Post vital signs: Reviewed and stable  Level of consciousness: awake and alert   Complications: No apparent anesthesia complications

## 2013-03-21 NOTE — Transfer of Care (Signed)
Immediate Anesthesia Transfer of Care Note  Patient: Samuel Henry  Procedure(s) Performed: Procedure(s) (LRB): KNEE ARTHROSCOPY WITH MEDIAL MENISECTOMY (Right)  Patient Location: PACU  Anesthesia Type: General  Level of Consciousness: awake  Airway & Oxygen Therapy: Patient Spontanous Breathing and non-rebreather face mask  Post-op Assessment: Report given to PACU RN, Post -op Vital signs reviewed and stable and Patient moving all extremities  Post vital signs: Reviewed and stable  Complications: No apparent anesthesia complications

## 2013-03-21 NOTE — Brief Op Note (Signed)
03/21/2013  2:00 PM  PATIENT:  Samuel Henry  52 y.o. male  PRE-OPERATIVE DIAGNOSIS:  Torn meniscus right knee, oateoarthritis  POST-OPERATIVE DIAGNOSIS:  Torn meniscus right knee, osteoarthritis   FINDINGS: RETEAR MEDIAL MENISCUS  GRADE 2 OA MFC AND GRADE 2 OA PATELLA MEDIAN RIDGE   PROCEDURE:  Procedure(s): KNEE ARTHROSCOPY WITH MEDIAL MENISECTOMY (Right)  SURGEON:  Surgeon(s) and Role:    * Walker Sitar E Sutton Hirsch, MD - Primary  PHYSICIAN ASSISTANT:   ASSISTANTS: none   ANESTHESIA:   general  EBL:  Total I/O In: 800 [I.V.:800] Out: 0   BLOOD ADMINISTERED:0 CC PRBC  DRAINS: none   LOCAL MEDICATIONS USED:  MARCAINE 0.5% WITH EPI    and Amount: 60 ml  SPECIMEN:  No Specimen  DISPOSITION OF SPECIMEN:  N/A  COUNTS:  YES  TOURNIQUET:    DICTATION: .Dragon Dictation  PLAN OF CARE: Discharge to home after PACU  PATIENT DISPOSITION:  PACU - hemodynamically stable.   Delay start of Pharmacological VTE agent (>24hrs) due to surgical blood loss or risk of bleeding: not applicable  Indications for procedure pain mechanical symptoms unresponsive to nonoperative treatment  The patient was identified in the preop holding area as Samuel Henry the RIGHT  knee was confirmed as a surgical site and marked. The chart was reviewed  The patient was taken to the operating room and  was given appropriate preoperative antibiotic and general anesthesia was administered. The operative leg (RIGHT ) was placed in the arthroscopic leg holder, the well leg was placed in a well leg holder  The RIGHT  leg was then prepped and draped sterile The surgical site was confirmed and the timeout procedure was completed  The lateral portal was injected with Marcaine with epinephrine solution and a stab wound was made. The scope was placed in the lateral portal into the medial compartment. The  Diagnostic portion of the  arthroscopy was completed. A medial portal was established in the same fashion and a  probe was placed into the joint. The diagnostic arthroscopy   was repeated using a probe to palpate intra-articular structures  A duckbill forceps was used to morcellized the meniscal tear AT THE POSTERIOR HORN, the fragments were removed with a motorized shaver. The meniscus was then balanced with an ArthroCare wand 50 probe.  A probe was then used to confirm a stable rim.  WE THEN ADDRESSED THE MFC BY CHONDROPLASTY  FOLLOWED BY CHONDROPLASTY OF THE PATELLA TO A STABLE BASE   The knee was irrigated meniscal fragments remaining were removed. The portals were closed with 3-0 nylon suture. The knee joint was then injected with 45 cc of Marcaine with epinephrine. A sterile dressing was applied followed by an Ace bandage and a Cryo/Cuff.  The patient was extubated and taken to the recovery room in stable condition. 

## 2013-03-21 NOTE — Progress Notes (Signed)
Graham crackers given to eat. Sprite given to drink. Tolerated well.

## 2013-03-21 NOTE — Anesthesia Preprocedure Evaluation (Signed)
Anesthesia Evaluation  Patient identified by MRN, date of birth, ID band Patient awake    Reviewed: Allergy & Precautions, H&P , NPO status , Patient's Chart, lab work & pertinent test results  History of Anesthesia Complications Negative for: history of anesthetic complications  Airway Mallampati: I TM Distance: >3 FB     Dental  (+) Teeth Intact   Pulmonary neg pulmonary ROS,  breath sounds clear to auscultation        Cardiovascular negative cardio ROS  Rhythm:Regular Rate:Normal     Neuro/Psych PSYCHIATRIC DISORDERS Depression    GI/Hepatic GERD-  Medicated and Controlled,  Endo/Other    Renal/GU      Musculoskeletal   Abdominal   Peds  Hematology   Anesthesia Other Findings   Reproductive/Obstetrics                           Anesthesia Physical Anesthesia Plan  ASA: II  Anesthesia Plan: General   Post-op Pain Management:    Induction: Intravenous, Rapid sequence and Cricoid pressure planned  Airway Management Planned: Oral ETT  Additional Equipment:   Intra-op Plan:   Post-operative Plan: Extubation in OR  Informed Consent: I have reviewed the patients History and Physical, chart, labs and discussed the procedure including the risks, benefits and alternatives for the proposed anesthesia with the patient or authorized representative who has indicated his/her understanding and acceptance.     Plan Discussed with:   Anesthesia Plan Comments:         Anesthesia Quick Evaluation

## 2013-03-21 NOTE — Interval H&P Note (Signed)
History and Physical Interval Note:  03/21/2013 12:22 PM  Samuel Henry  has presented today for surgery, with the diagnosis of Torn meniscus right knee  The various methods of treatment have been discussed with the patient and family. After consideration of risks, benefits and other options for treatment, the patient has consented to  Procedure(s): KNEE ARTHROSCOPY WITH MEDIAL MENISECTOMY (Right) as a surgical intervention .  The patient's history has been reviewed, patient examined, no change in status, stable for surgery.  I have reviewed the patient's chart and labs.  Questions were answered to the patient's satisfaction.     Fuller Canada

## 2013-03-21 NOTE — Anesthesia Procedure Notes (Addendum)
Procedure Name: Intubation Date/Time: 03/21/2013 1:17 PM Performed by: Franco Nones Pre-anesthesia Checklist: Patient identified, Patient being monitored, Timeout performed, Emergency Drugs available and Suction available Patient Re-evaluated:Patient Re-evaluated prior to inductionOxygen Delivery Method: Circle System Utilized Preoxygenation: Pre-oxygenation with 100% oxygen Intubation Type: IV induction, Rapid sequence and Cricoid Pressure applied Ventilation: Mask ventilation without difficulty Laryngoscope Size: Miller and 2 Grade View: Grade I Tube type: Oral Tube size: 7.0 mm Number of attempts: 2 (stylet repositioned in ETT;easy grade 1 view) Airway Equipment and Method: stylet and Oral airway Placement Confirmation: ETT inserted through vocal cords under direct vision,  positive ETCO2 and breath sounds checked- equal and bilateral Secured at: 22 cm Tube secured with: Tape Dental Injury: Teeth and Oropharynx as per pre-operative assessment

## 2013-03-21 NOTE — Op Note (Signed)
03/21/2013  2:00 PM  PATIENT:  Samuel Henry  52 y.o. male  PRE-OPERATIVE DIAGNOSIS:  Torn meniscus right knee, oateoarthritis  POST-OPERATIVE DIAGNOSIS:  Torn meniscus right knee, osteoarthritis   FINDINGS: RETEAR MEDIAL MENISCUS  GRADE 2 OA MFC AND GRADE 2 OA PATELLA MEDIAN RIDGE   PROCEDURE:  Procedure(s): KNEE ARTHROSCOPY WITH MEDIAL MENISECTOMY (Right)  SURGEON:  Surgeon(s) and Role:    * Vickki Hearing, MD - Primary  PHYSICIAN ASSISTANT:   ASSISTANTS: none   ANESTHESIA:   general  EBL:  Total I/O In: 800 [I.V.:800] Out: 0   BLOOD ADMINISTERED:0 CC PRBC  DRAINS: none   LOCAL MEDICATIONS USED:  MARCAINE 0.5% WITH EPI    and Amount: 60 ml  SPECIMEN:  No Specimen  DISPOSITION OF SPECIMEN:  N/A  COUNTS:  YES  TOURNIQUET:    DICTATION: .Dragon Dictation  PLAN OF CARE: Discharge to home after PACU  PATIENT DISPOSITION:  PACU - hemodynamically stable.   Delay start of Pharmacological VTE agent (>24hrs) due to surgical blood loss or risk of bleeding: not applicable  Indications for procedure pain mechanical symptoms unresponsive to nonoperative treatment  The patient was identified in the preop holding area as Samuel Henry the RIGHT  knee was confirmed as a surgical site and marked. The chart was reviewed  The patient was taken to the operating room and  was given appropriate preoperative antibiotic and general anesthesia was administered. The operative leg (RIGHT ) was placed in the arthroscopic leg holder, the well leg was placed in a well leg holder  The RIGHT  leg was then prepped and draped sterile The surgical site was confirmed and the timeout procedure was completed  The lateral portal was injected with Marcaine with epinephrine solution and a stab wound was made. The scope was placed in the lateral portal into the medial compartment. The  Diagnostic portion of the  arthroscopy was completed. A medial portal was established in the same fashion and a  probe was placed into the joint. The diagnostic arthroscopy   was repeated using a probe to palpate intra-articular structures  A duckbill forceps was used to morcellized the meniscal tear AT THE POSTERIOR HORN, the fragments were removed with a motorized shaver. The meniscus was then balanced with an ArthroCare wand 50 probe.  A probe was then used to confirm a stable rim.  WE THEN ADDRESSED THE MFC BY CHONDROPLASTY  FOLLOWED BY CHONDROPLASTY OF THE PATELLA TO A STABLE BASE   The knee was irrigated meniscal fragments remaining were removed. The portals were closed with 3-0 nylon suture. The knee joint was then injected with 45 cc of Marcaine with epinephrine. A sterile dressing was applied followed by an Ace bandage and a Cryo/Cuff.  The patient was extubated and taken to the recovery room in stable condition.

## 2013-03-21 NOTE — OR Nursing (Signed)
Right knee clipped 10 inches above and 10 inches below.

## 2013-03-23 ENCOUNTER — Encounter (HOSPITAL_COMMUNITY): Payer: Self-pay | Admitting: Orthopedic Surgery

## 2013-03-24 ENCOUNTER — Ambulatory Visit (INDEPENDENT_AMBULATORY_CARE_PROVIDER_SITE_OTHER): Payer: BC Managed Care – PPO | Admitting: Orthopedic Surgery

## 2013-03-24 ENCOUNTER — Encounter: Payer: Self-pay | Admitting: Orthopedic Surgery

## 2013-03-24 VITALS — BP 132/75 | Ht 72.0 in | Wt 205.0 lb

## 2013-03-24 DIAGNOSIS — Z9889 Other specified postprocedural states: Secondary | ICD-10-CM

## 2013-03-24 DIAGNOSIS — M23329 Other meniscus derangements, posterior horn of medial meniscus, unspecified knee: Secondary | ICD-10-CM | POA: Insufficient documentation

## 2013-03-24 DIAGNOSIS — M23321 Other meniscus derangements, posterior horn of medial meniscus, right knee: Secondary | ICD-10-CM

## 2013-03-24 MED FILL — Epinephrine HCl Inj 1 MG/ML: INTRAMUSCULAR | Qty: 1 | Status: AC

## 2013-03-24 NOTE — Progress Notes (Signed)
Patient ID: Samuel Henry, male   DOB: 12-Dec-1960, 52 y.o.   MRN: [id number]  Chief Complaint  Patient presents with  . Follow-up    Post op 1 SARK DOS 03/21/13   BP 132/75  Ht 6' (1.829 m)  Wt 205 lb (92.987 kg)  BMI 27.8 kg/m2  Postop visit   S/P knee arthroscopy   DX medial meniscus tear with mild OA MFC and Patella   Procedure sark med menisec and MFC and P c-plasty  Operative Findings oa med and patella medial ridge, retear medial meniscus   Complaints none  Plan start PT   OOW oct 13 Ret 3 weeks

## 2013-03-24 NOTE — Patient Instructions (Signed)
Start therapy Monday

## 2013-03-28 ENCOUNTER — Ambulatory Visit (HOSPITAL_COMMUNITY)
Admission: RE | Admit: 2013-03-28 | Discharge: 2013-03-28 | Disposition: A | Discharge status: Home / Self Care | Payer: BC Managed Care – PPO | Source: Ambulatory Visit | Attending: Orthopedic Surgery | Admitting: Orthopedic Surgery

## 2013-03-28 DIAGNOSIS — M25669 Stiffness of unspecified knee, not elsewhere classified: Secondary | ICD-10-CM | POA: Insufficient documentation

## 2013-03-28 DIAGNOSIS — R29898 Other symptoms and signs involving the musculoskeletal system: Secondary | ICD-10-CM | POA: Insufficient documentation

## 2013-03-28 DIAGNOSIS — M25569 Pain in unspecified knee: Secondary | ICD-10-CM | POA: Insufficient documentation

## 2013-03-28 DIAGNOSIS — IMO0001 Reserved for inherently not codable concepts without codable children: Secondary | ICD-10-CM | POA: Insufficient documentation

## 2013-03-28 NOTE — Evaluation (Addendum)
Physical Therapy Evaluation  Patient Details  Name: Samuel Henry MRN: [id number] Date of Birth: [birth year]  Today's Date: 03/28/2013 Time: 1520-1600 PT Time Calculation (min): 40 min Charge eval             Visit#: 1 of 9  Re-eval: 04/27/13 Assessment Diagnosis: Rt s/p medial menisectomy Surgical Date: 03/21/13 Next MD Visit: 04/16/2013 Prior Therapy: none  Authorization: BCBS     Past Medical History:  Past Medical History  Diagnosis Date  . Depression   . Acid reflux   . History of kidney stones    Past Surgical History:  Past Surgical History  Procedure Laterality Date  . Knee surgery Right 2010  . Knee arthroscopy with medial menisectomy Right 03/21/2013    Procedure: KNEE ARTHROSCOPY WITH MEDIAL MENISECTOMY;  Surgeon: Vickki Hearing, MD;  Location: [facility];  Service: Orthopedics;  Laterality: Right;  . Chondroplasty Right 03/21/2013    Procedure: MEDIAL AND PATELLAR CHONDROPLASTY;  Surgeon: Vickki Hearing, MD;  Location: [facility];  Service: Orthopedics;  Laterality: Right;    Subjective Symptoms/Limitations Symptoms: Pt states that he had knee surgery on 03/21/2013.  Since the surgery he has been doing well.  He is no longer using crutches.  He does not really have  pain it just aches occasionally. How long can you sit comfortably?: Pt is able to sit for about an hour. How long can you stand comfortably?: he is able to stand for an hour How long can you walk comfortably?: He is able to walk for 15-20 minutes. Pain Assessment Currently in Pain?: No/denies (highest pain over the weekend has beeen a 5/10) Pain Location: Knee Pain Orientation: Right Pain Type: Surgical pain Pain Onset: In the past 7 days Pain Frequency: Intermittent Pain Relieving Factors: activity Effect of Pain on Daily Activities: improves stiffness  Prior Function Vocation: Full time employment Vocation Requirements: Data processing manager, stoop, bend, climb ladders Leisure: Hobbies-yes  (Comment) Comments: hunt, grandchildren    Assessment RLE AROM (degrees) Right Knee Extension: 4 Right Knee Flexion: 125 RLE Strength Right Hip Flexion:  (4+/5) Right Hip Extension: 4/5 Right Hip ABduction: 5/5 Right Hip ADduction: 5/5 Right Knee Flexion: 4/5 Right Knee Extension: 5/5 Right Ankle Dorsiflexion: 5/5  Exercise/Treatments    Stretches Active Hamstring Stretch: 3 reps;30 seconds   Standing Heel Raises: 10 reps Functional Squat: 10 reps SLS: x2 max 15 seconds.   Supine Quad Sets: 10 reps Terminal Knee Extension: 10 reps Prone  Hamstring Curl: 10 reps;Limitations Hamstring Curl Limitations: 4# Hip Extension: 10 reps    Physical Therapy Assessment and Plan PT Assessment and Plan Clinical Impression Statement: Pt s/p rt arthroscopic surgery who demonstrates decreased ROM, decreased strength and decreased balance who will benefit from skilled PT to return him to prior functional level and work activities. Pt will benefit from skilled therapeutic intervention in order to improve on the following deficits: Pain;Decreased balance;Decreased activity tolerance;Decreased range of motion;Decreased strength Rehab Potential: Good PT Frequency: Min 3X/week PT Duration:  (3 weeks) PT Treatment/Interventions: Therapeutic activities;Therapeutic exercise;Balance training;Manual techniques;Modalities PT Plan: See for high level activity to return to high function in order to resume work    Goals Home Exercise Program Pt/caregiver will Perform Home Exercise Program: For increased ROM;For increased strengthening PT Short Term Goals Time to Complete Short Term Goals: 2 weeks PT Short Term Goal 1: Pt ROM to be 0-130 to allow normal walking  PT Short Term Goal 2: Pt to be able to squat to the floor to  pick items up PT Long Term Goals Time to Complete Long Term Goals: 4 weeks PT Long Term Goal 1: strength to be wnl to allow ladder and stair climbing PT Long Term Goal 2: Pt  to be able to return to work   Problem List Patient Active Problem List   Diagnosis Date Noted  . Stiffness of joint, not elsewhere classified, lower leg 03/28/2013  . Leg weakness 03/28/2013  . S/P right knee arthroscopy 03/24/2013  . Medial meniscus, posterior horn derangement 03/24/2013  . Acute medial meniscus tear of left knee 01/12/2013  . Generalized anxiety disorder 10/08/2012  . Insomnia 10/08/2012  . GERD (gastroesophageal reflux disease) 10/01/2012  . ARTHROSCOPY, RIGHT KNEE, HX OF 07/03/2010  . ARTHRITIS, RIGHT KNEE 06/18/2010  . LOOSE BODY-KNEE 06/18/2010  . OLD DISRUPTION OF ANTERIOR CRUCIATE LIGAMENT 06/18/2010  . DERANGEMENT OF POSTERIOR HORN OF MEDIAL MENISCUS 05/28/2010  . KNEE PAIN 05/28/2010       GP    RUSSELL,CINDY 03/28/2013, 4:17 PM  Physician Documentation Your signature is required to indicate approval of the treatment plan as stated above.  Please sign and either send electronically or make a copy of this report for your files and return this physician signed original.   Please Khaalid one 1.__approve of plan  2. ___approve of plan with the following conditions.   ______________________________                                                          _____________________ Physician Signature                                                                                                             Date

## 2013-03-30 ENCOUNTER — Ambulatory Visit (HOSPITAL_COMMUNITY)
Admission: RE | Admit: 2013-03-30 | Discharge: 2013-03-30 | Disposition: A | Discharge status: Home / Self Care | Payer: BC Managed Care – PPO | Source: Ambulatory Visit | Attending: Orthopedic Surgery | Admitting: Orthopedic Surgery

## 2013-03-30 ENCOUNTER — Ambulatory Visit (HOSPITAL_COMMUNITY): Payer: BC Managed Care – PPO | Admitting: Physical Therapy

## 2013-03-30 NOTE — Progress Notes (Signed)
Physical Therapy Treatment Patient Details  Name: Samuel Henry MRN: [id number] Date of Birth: [birth year]  Today's Date: 03/30/2013 Time: 1600-1640 PT Time Calculation (min): 40 min Charges: Therex x 38'   Visit#: 2 of 9  Re-eval: 04/27/13  Authorization: BCBS   Subjective: Symptoms/Limitations Symptoms: Pt reports HEP compliance.  Pain Assessment Currently in Pain?: No/denies Pain Score: 3  Pain Location: Knee Pain Orientation: Right   Exercise/Treatments Aerobic Stationary Bike: 6'@2 .0 Standing Heel Raises: 10 reps;Limitations Heel Raises Limitations: Toe raises x 10 Lateral Step Up: 10 reps;Step Height: 4";Hand Hold: 2;Right Functional Squat: 10 reps Rocker Board: 2 minutes SLS: RLE: 1' SLS with Vectors: 3x5" Supine Quad Sets: 10 reps Short Arc Quad Sets: 10 reps;Limitations Short Arc Quad Sets Limitations: 5" holds Terminal Knee Extension: 10 reps Bridges: 10 reps Straight Leg Raises: 10 reps Prone  Hamstring Curl: 10 reps Hamstring Curl Limitations: 5# Hip Extension: 10 reps  Physical Therapy Assessment and Plan PT Assessment and Plan Clinical Impression Statement: Pt tolerates progression of strengthening exercises well. Pt completes exercises with good form after initial cueing and demo. Pt requires multimodal cueing to facilitate distal quad contraction with quad sets. Pt denies ice and states that he will ice at home. Pt reports no change in pain at end of session. Pt will benefit from skilled therapeutic intervention in order to improve on the following deficits: Pain;Decreased balance;Decreased activity tolerance;Decreased range of motion;Decreased strength Rehab Potential: Good PT Frequency: Min 3X/week PT Duration:  (3 weeks) PT Treatment/Interventions: Therapeutic activities;Therapeutic exercise;Balance training;Manual techniques;Modalities PT Plan: Continue to progress per PT POC.     Problem List Patient Active Problem List   Diagnosis Date  Noted  . Stiffness of joint, not elsewhere classified, lower leg 03/28/2013  . Leg weakness 03/28/2013  . S/P right knee arthroscopy 03/24/2013  . Medial meniscus, posterior horn derangement 03/24/2013  . Acute medial meniscus tear of left knee 01/12/2013  . Generalized anxiety disorder 10/08/2012  . Insomnia 10/08/2012  . GERD (gastroesophageal reflux disease) 10/01/2012  . ARTHROSCOPY, RIGHT KNEE, HX OF 07/03/2010  . ARTHRITIS, RIGHT KNEE 06/18/2010  . LOOSE BODY-KNEE 06/18/2010  . OLD DISRUPTION OF ANTERIOR CRUCIATE LIGAMENT 06/18/2010  . DERANGEMENT OF POSTERIOR HORN OF MEDIAL MENISCUS 05/28/2010  . KNEE PAIN 05/28/2010    PT - End of Session Activity Tolerance: Patient tolerated treatment well General Behavior During Therapy: [facility] for tasks assessed/performed  Seth Bake, PTA  03/30/2013, 5:05 PM

## 2013-04-01 ENCOUNTER — Ambulatory Visit (HOSPITAL_COMMUNITY): Payer: BC Managed Care – PPO

## 2013-04-01 ENCOUNTER — Ambulatory Visit (HOSPITAL_COMMUNITY)
Admission: RE | Admit: 2013-04-01 | Discharge: 2013-04-01 | Disposition: A | Discharge status: Home / Self Care | Payer: BC Managed Care – PPO | Source: Ambulatory Visit

## 2013-04-01 DIAGNOSIS — M25669 Stiffness of unspecified knee, not elsewhere classified: Secondary | ICD-10-CM

## 2013-04-01 DIAGNOSIS — R29898 Other symptoms and signs involving the musculoskeletal system: Secondary | ICD-10-CM

## 2013-04-01 NOTE — Progress Notes (Signed)
Physical Therapy Treatment Patient Details  Name: Samuel Henry MRN: [id number] Date of Birth: [birth year]  Today's Date: 04/01/2013 Time: 4098-1191 PT Time Calculation (min): 45 min Charge: TE [id number]  Visit#: 3 of 9  Re-eval: 04/27/13 Assessment Diagnosis: Rt s/p medial menisectomy Surgical Date: 03/21/13 Next MD Visit: Romeo Apple 04/16/2013 Prior Therapy: none  Authorization: BCBS  Authorization Time Period:    Authorization Visit#:   of     Subjective: Symptoms/Limitations Symptoms: Pt stated Rt knee pain 3/10 Pain Assessment Currently in Pain?: Yes Pain Score: 3  Pain Location: Knee Pain Orientation: Right  Objective:   Exercise/Treatments Aerobic Stationary Bike: 8' @ 3.0 Standing Heel Raises: Limitations Heel Raises Limitations: heel and toe walking 1RT Lateral Step Up: Right;15 reps;Hand Hold: 1;Step Height: 4" Forward Step Up: Right;10 reps;Hand Hold: 0;Step Height: 4" Functional Squat: 15 reps Rocker Board: 2 minutes;Limitations Rocker Board Limitations: R/L and A/P SLS with Vectors: 5x 5" Supine Quad Sets: 10 reps Short Arc Quad Sets: 10 reps;Limitations Short Arc Quad Sets Limitations: 5" holds Terminal Knee Extension: 10 reps;Limitations Terminal Knee Extension Limitations: 5" holds Bridges: 10 reps Straight Leg Raises: 10 reps;Limitations Straight Leg Raises Limitations: 3# Prone  Hamstring Curl: 10 reps Hamstring Curl Limitations: 5# Hip Extension: 10 reps;Limitations Hip Extension Limitations: 3#      Physical Therapy Assessment and Plan PT Assessment and Plan Clinical Impression Statement: Progressed functional strengtheing exercises with good form demonstrated following initial cueing for technique.  Improving ROM 2 degrees lacking for full knee extension.  Added exercises for quad strengthening to improve ROM. PT Plan: Continue to progress per PT POC.  Next session begin Nustep for strengthening, step down for eccentric quad  strengthening and begin proper lifting for RTW, sled pushing, climbing ladder when ready.      Goals Home Exercise Program Pt/caregiver will Perform Home Exercise Program: For increased ROM;For increased strengthening PT Short Term Goals Time to Complete Short Term Goals: 2 weeks PT Short Term Goal 1: Pt ROM to be 0-130 to allow normal walking  PT Short Term Goal 1 - Progress: Progressing toward goal PT Short Term Goal 2: Pt to be able to squat to the floor to pick items up PT Long Term Goals Time to Complete Long Term Goals: 4 weeks PT Long Term Goal 1: strength to be wnl to allow ladder and stair climbing PT Long Term Goal 2: Pt to be able to return to work   Problem List Patient Active Problem List   Diagnosis Date Noted  . Stiffness of joint, not elsewhere classified, lower leg 03/28/2013  . Leg weakness 03/28/2013  . S/P right knee arthroscopy 03/24/2013  . Medial meniscus, posterior horn derangement 03/24/2013  . Acute medial meniscus tear of left knee 01/12/2013  . Generalized anxiety disorder 10/08/2012  . Insomnia 10/08/2012  . GERD (gastroesophageal reflux disease) 10/01/2012  . ARTHROSCOPY, RIGHT KNEE, HX OF 07/03/2010  . ARTHRITIS, RIGHT KNEE 06/18/2010  . LOOSE BODY-KNEE 06/18/2010  . OLD DISRUPTION OF ANTERIOR CRUCIATE LIGAMENT 06/18/2010  . DERANGEMENT OF POSTERIOR HORN OF MEDIAL MENISCUS 05/28/2010  . KNEE PAIN 05/28/2010    PT - End of Session Activity Tolerance: Patient tolerated treatment well General Behavior During Therapy: [facility] for tasks assessed/performed  GP    Juel Burrow 04/01/2013, 5:44 PM

## 2013-04-04 ENCOUNTER — Ambulatory Visit (HOSPITAL_COMMUNITY)
Admission: RE | Admit: 2013-04-04 | Discharge: 2013-04-04 | Disposition: A | Discharge status: Home / Self Care | Payer: BC Managed Care – PPO | Source: Ambulatory Visit | Attending: Orthopedic Surgery | Admitting: Orthopedic Surgery

## 2013-04-04 ENCOUNTER — Ambulatory Visit (HOSPITAL_COMMUNITY): Payer: BC Managed Care – PPO | Admitting: Physical Therapy

## 2013-04-04 NOTE — Progress Notes (Signed)
Physical Therapy Treatment Patient Details  Name: Samuel Henry MRN: [id number] Date of Birth: 06/30/1961  Today's Date: 04/04/2013 Time: 1520-1600 PT Time Calculation (min): 40 min Visit#: 4 of 9  Re-eval: 04/27/13 Authorization: BCBS  Charges:  therex 38'  Subjective: Symptoms/Limitations Symptoms: Pt states he really has no pain, only discomfort approx 2/10.  Reports compliance with HEP.   Exercise/Treatments Aerobic Stationary Bike: NuStep 8' level 3 hills #3 Standing Heel Raises: Limitations Heel Raises Limitations: heel and toe walking 1RT Lateral Step Up: Right;15 reps;Hand Hold: 1;Step Height: 4" Forward Step Up: Right;10 reps;Hand Hold: 0;Step Height: 4" Functional Squat: 15 reps Rocker Board: 2 minutes;Limitations Rocker Board Limitations: R/L and A/P SLS with Vectors: 5x 5" Other Standing Knee Exercises: lifting floor to waist empty 8# box 2X for technique Other Standing Knee Exercises: push pull sled empty Supine Straight Leg Raises: 2 sets;10 reps Straight Leg Raises Limitations: 5# Prone  Hamstring Curl: 2 sets;10 reps Hamstring Curl Limitations: 5# Hip Extension: 2 sets;10 reps Hip Extension Limitations: 5#      Physical Therapy Assessment and Plan PT Assessment and Plan Clinical Impression Statement: Progressed functional activity training today to include body mechanics for proper lifting, pushing and pulling.  Pt required postural cues due to being forward bent.  No c/o pain during or after session. PT Plan: Progress for RTW with appropriate functional activities; Add reciprocal steps and  climbing ladder when ready.       Problem List Patient Active Problem List   Diagnosis Date Noted  . Stiffness of joint, not elsewhere classified, lower leg 03/28/2013  . Leg weakness 03/28/2013  . S/P right knee arthroscopy 03/24/2013  . Medial meniscus, posterior horn derangement 03/24/2013  . Acute medial meniscus tear of left knee 01/12/2013  .  Generalized anxiety disorder 10/08/2012  . Insomnia 10/08/2012  . GERD (gastroesophageal reflux disease) 10/01/2012  . ARTHROSCOPY, RIGHT KNEE, HX OF 07/03/2010  . ARTHRITIS, RIGHT KNEE 06/18/2010  . LOOSE BODY-KNEE 06/18/2010  . OLD DISRUPTION OF ANTERIOR CRUCIATE LIGAMENT 06/18/2010  . DERANGEMENT OF POSTERIOR HORN OF MEDIAL MENISCUS 05/28/2010  . KNEE PAIN 05/28/2010    PT - End of Session Activity Tolerance: Patient tolerated treatment well General Behavior During Therapy: [facility] for tasks assessed/performed   Lurena Nida, PTA/CLT 04/04/2013, 4:05 PM

## 2013-04-06 ENCOUNTER — Ambulatory Visit (HOSPITAL_COMMUNITY)
Admission: RE | Admit: 2013-04-06 | Discharge: 2013-04-06 | Disposition: A | Discharge status: Home / Self Care | Payer: BC Managed Care – PPO | Source: Ambulatory Visit | Attending: Family Medicine | Admitting: Family Medicine

## 2013-04-06 ENCOUNTER — Ambulatory Visit (HOSPITAL_COMMUNITY): Payer: BC Managed Care – PPO

## 2013-04-06 DIAGNOSIS — M25669 Stiffness of unspecified knee, not elsewhere classified: Secondary | ICD-10-CM

## 2013-04-06 DIAGNOSIS — R29898 Other symptoms and signs involving the musculoskeletal system: Secondary | ICD-10-CM

## 2013-04-06 NOTE — Progress Notes (Signed)
Physical Therapy Treatment Patient Details  Name: Samuel Henry MRN: [id number] Date of Birth: 03-17-1961  Today's Date: 04/06/2013 Time: 4098-1191 PT Time Calculation (min): 48 min Charge: TE [id number]  Visit#: 5 of 9  Re-eval: 04/27/13 Assessment Diagnosis: Rt s/p medial menisectomy Surgical Date: 03/21/13 Next MD Visit: Romeo Apple 04/16/2013 Prior Therapy: none  Authorization: BCBS  Authorization Time Period:    Authorization Visit#:   of     Subjective: Symptoms/Limitations Symptoms: Pt stated he is pain free today.   Pain Assessment Currently in Pain?: No/denies  Exercise/Treatments Stretches Active Hamstring Stretch: 3 reps;30 seconds;Limitations Active Hamstring Stretch Limitations: with rope Quad Stretch: 3 reps;30 seconds Gastroc Stretch: 3 reps;30 seconds;Limitations Gastroc Stretch Limitations: slant board Aerobic Stationary Bike: NuStep 10' level 4 hills #3 Machines for Strengthening Cybex Knee Extension: 2 PL 10x Rt LE only Cybex Knee Flexion: 4 PL 10x Rt only Standing Lateral Step Up: Right;10 reps;Hand Hold: 0;Step Height: 6" Forward Step Up: Right;10 reps;Hand Hold: 0;Step Height: 6" Step Down: Right;10 reps;Hand Hold: 0;Step Height: 6" Functional Squat: 10 reps;Limitations Functional Squat Limitations: on BOSU Stairs: 3 RT reciprocal no handrail   SLS with Vectors: 5x 5" on foam Other Standing Knee Exercises: lifting floor to top of blue cabinets 8# box 5X for technique Other Standing Knee Exercises: push sled with 110#      Physical Therapy Assessment and Plan PT Assessment and Plan Clinical Impression Statement: Advanced therex with RTW activities including cybex machines for quad and hamstring strengthening, walking with sports cord for resitance, reciprocal pattern stair training, sled pushing with weight and proper lifting techniques, min cueing required for technique to improve body mechanics with pushing and lifting.  Pt reported no increased  pain through session.  Encouraged pt to apply ice to knee at home to reduce edema and control pain. PT Plan: Progress for RTW with appropriate functional activities; Add climbing ladder next session if available.      Goals Home Exercise Program Pt/caregiver will Perform Home Exercise Program: For increased ROM;For increased strengthening PT Short Term Goals Time to Complete Short Term Goals: 2 weeks PT Short Term Goal 1: Pt ROM to be 0-130 to allow normal walking  PT Short Term Goal 1 - Progress: Progressing toward goal PT Short Term Goal 2: Pt to be able to squat to the floor to pick items up PT Short Term Goal 2 - Progress: Progressing toward goal PT Long Term Goals Time to Complete Long Term Goals: 4 weeks PT Long Term Goal 1: strength to be wnl to allow ladder and stair climbing PT Long Term Goal 1 - Progress: Progressing toward goal PT Long Term Goal 2: Pt to be able to return to work   Problem List Patient Active Problem List   Diagnosis Date Noted  . Stiffness of joint, not elsewhere classified, lower leg 03/28/2013  . Leg weakness 03/28/2013  . S/P right knee arthroscopy 03/24/2013  . Medial meniscus, posterior horn derangement 03/24/2013  . Acute medial meniscus tear of left knee 01/12/2013  . Generalized anxiety disorder 10/08/2012  . Insomnia 10/08/2012  . GERD (gastroesophageal reflux disease) 10/01/2012  . ARTHROSCOPY, RIGHT KNEE, HX OF 07/03/2010  . ARTHRITIS, RIGHT KNEE 06/18/2010  . LOOSE BODY-KNEE 06/18/2010  . OLD DISRUPTION OF ANTERIOR CRUCIATE LIGAMENT 06/18/2010  . DERANGEMENT OF POSTERIOR HORN OF MEDIAL MENISCUS 05/28/2010  . KNEE PAIN 05/28/2010    PT - End of Session Activity Tolerance: Patient tolerated treatment well General Behavior During Therapy: [facility] for tasks assessed/performed  GP    Juel Burrow 04/06/2013, 5:09 PM

## 2013-04-08 ENCOUNTER — Ambulatory Visit (HOSPITAL_COMMUNITY): Payer: BC Managed Care – PPO

## 2013-04-08 ENCOUNTER — Ambulatory Visit (HOSPITAL_COMMUNITY)
Admission: RE | Admit: 2013-04-08 | Discharge: 2013-04-08 | Disposition: A | Discharge status: Home / Self Care | Payer: BC Managed Care – PPO | Source: Ambulatory Visit | Attending: Family Medicine | Admitting: Family Medicine

## 2013-04-08 DIAGNOSIS — R29898 Other symptoms and signs involving the musculoskeletal system: Secondary | ICD-10-CM

## 2013-04-08 DIAGNOSIS — M25669 Stiffness of unspecified knee, not elsewhere classified: Secondary | ICD-10-CM

## 2013-04-08 NOTE — Progress Notes (Signed)
Physical Therapy Treatment Patient Details  Name: Samuel Henry MRN: [id number] Date of Birth: 03/27/1961  Today's Date: 04/08/2013 Time: 4098-1191 PT Time Calculation (min): 44 min Charge: TE [id number]  Visit#: 6 of 9  Re-eval: 04/27/13 Assessment Diagnosis: Rt s/p medial menisectomy Surgical Date: 03/21/13 Next MD Visit: Romeo Apple 04/16/2013 Prior Therapy: none  Authorization: BCBS  Authorization Time Period:    Authorization Visit#:   of     Subjective: Symptoms/Limitations Symptoms: Pt stated knee is feeling good, reported increased LBP when he woke up this morning.   Pain Assessment Currently in Pain?: Yes Pain Score: 2  Pain Location: Back Pain Orientation: Lower  Exercise/Treatments Aerobic Stationary Bike: NuStep 10' level 4 hills #3 Machines for Strengthening Cybex Knee Extension: 2.5 PL 15x Rt LE only eccentrc control Cybex Knee Flexion: 4.5 15xRt only Standing Lateral Step Up: Right;15 reps;Hand Hold: 0;Step Height: 6" Forward Step Up: Right;15 reps;Hand Hold: 0;Step Height: 6" Step Down: Right;15 reps;Hand Hold: 0;Step Height: 6" Functional Squat: 10 reps;Limitations Functional Squat Limitations: on BOSU Wall Squat: 10 reps;10 seconds SLS with Vectors: 5x 5" on foam Walking with Sports Cord: thick sports cord 1 RT each direction;  Pt standing on BOSU with sports cord in door pulling Other Standing Knee Exercises: lifting floor to top of blue cabinets 8# box 10X for technique Other Standing Knee Exercises: push sled on carpet      Physical Therapy Assessment and Plan PT Assessment and Plan Clinical Impression Statement: Continued therex focus with RTW activities.  Pt able to complete exercises with good technique and control.  Noted fatigue with activity, no reports of increased pain through session just diffficulty.  Encuoraged pt to apply ice to knee at home to reduce edema and control pain.   PT Plan: Progress for RTW with appropriate functional  activities; Add climbing ladder and increase step height next session    Goals Home Exercise Program Pt/caregiver will Perform Home Exercise Program: For increased ROM;For increased strengthening PT Short Term Goals Time to Complete Short Term Goals: 2 weeks PT Short Term Goal 1: Pt ROM to be 0-130 to allow normal walking  PT Short Term Goal 2: Pt to be able to squat to the floor to pick items up PT Short Term Goal 2 - Progress: Progressing toward goal PT Long Term Goals Time to Complete Long Term Goals: 4 weeks PT Long Term Goal 1: strength to be wnl to allow ladder and stair climbing PT Long Term Goal 1 - Progress: Progressing toward goal PT Long Term Goal 2: Pt to be able to return to work   Problem List Patient Active Problem List   Diagnosis Date Noted  . Stiffness of joint, not elsewhere classified, lower leg 03/28/2013  . Leg weakness 03/28/2013  . S/P right knee arthroscopy 03/24/2013  . Medial meniscus, posterior horn derangement 03/24/2013  . Acute medial meniscus tear of left knee 01/12/2013  . Generalized anxiety disorder 10/08/2012  . Insomnia 10/08/2012  . GERD (gastroesophageal reflux disease) 10/01/2012  . ARTHROSCOPY, RIGHT KNEE, HX OF 07/03/2010  . ARTHRITIS, RIGHT KNEE 06/18/2010  . LOOSE BODY-KNEE 06/18/2010  . OLD DISRUPTION OF ANTERIOR CRUCIATE LIGAMENT 06/18/2010  . DERANGEMENT OF POSTERIOR HORN OF MEDIAL MENISCUS 05/28/2010  . KNEE PAIN 05/28/2010    PT - End of Session Activity Tolerance: Patient tolerated treatment well General Behavior During Therapy: [facility] for tasks assessed/performed  GP    Juel Burrow 04/08/2013, 4:03 PM

## 2013-04-11 ENCOUNTER — Ambulatory Visit (HOSPITAL_COMMUNITY)
Admission: RE | Admit: 2013-04-11 | Discharge: 2013-04-11 | Disposition: A | Discharge status: Home / Self Care | Payer: BC Managed Care – PPO | Source: Ambulatory Visit | Attending: *Deleted | Admitting: *Deleted

## 2013-04-11 NOTE — Evaluation (Signed)
Physical Therapy Discharge Summary  Patient Details  Name: Samuel Henry MRN: [id number] Date of Birth: 09/27/1960  Today's Date: 04/11/2013 Time: 1630-1705 PT Time Calculation (min): 35 min Charges: Therex x 18' MMT x 1 Self care x 8'              Visit#: 7 of 9  Re-eval: 04/27/13 Assessment Diagnosis: Rt s/p medial menisectomy Surgical Date: 03/21/13 Next MD Visit: Romeo Apple 04/16/2013 Prior Therapy: none  Authorization: BCBS      Past Medical History:  Past Medical History  Diagnosis Date  . Depression   . Acid reflux   . History of kidney stones    Past Surgical History:  Past Surgical History  Procedure Laterality Date  . Knee surgery Right 2010  . Knee arthroscopy with medial menisectomy Right 03/21/2013    Procedure: KNEE ARTHROSCOPY WITH MEDIAL MENISECTOMY;  Surgeon: Vickki Hearing, MD;  Location: [facility];  Service: Orthopedics;  Laterality: Right;  . Chondroplasty Right 03/21/2013    Procedure: MEDIAL AND PATELLAR CHONDROPLASTY;  Surgeon: Vickki Hearing, MD;  Location: [facility];  Service: Orthopedics;  Laterality: Right;    Subjective Symptoms/Limitations Symptoms: Pt states that he has some tenderness around his knee but no pain. Pain Assessment Currently in Pain?: No/denies  Assessment RLE AROM (degrees) Right Knee Extension: 0 (was 4 on 03/28/13) Right Knee Flexion: 133 (was 125 on 03/28/13) RLE Strength Right Hip Flexion: 5/5 (was 4+/5 on 03/28/13) Right Hip Extension: 5/5 (was 4/5 on 03/28/13) Right Hip ABduction: 5/5 (was 5/5 on 03/28/13) Right Hip ADduction: 5/5 (was 5/5 on 03/28/13) Right Knee Flexion: 5/5 (was 4/5 on 03/28/13) Right Knee Extension: 5/5 (was 5/5 on 03/28/13) Right Ankle Dorsiflexion: 5/5 (was 5/5 on 03/28/13)  Exercise/Treatments Machines for Strengthening Cybex Knee Extension: 2.5pl 15x Rt LE only Cybex Knee Flexion: 5pl 15xRt only Standing Lateral Step Up: Right;15 reps;Hand Hold: 0;Step Height: 6" Step Down: Right;15 reps;Hand  Hold: 0;Step Height: 6" Functional Squat: 10 reps;Limitations Functional Squat Limitations: on BOSU Wall Squat: 10 reps;10 seconds Other Standing Knee Exercises: Climbing ladder with 6 rungs  Other Standing Knee Exercises: Kneeling to floor bilateral   Physical Therapy Assessment and Plan PT Assessment and Plan Clinical Impression Statement: Pt has progressed with therapy. He states that he is no longer limited by his knee. Pt is able to squats to lift objects from floor. Kneel to ground and climb ladder with minimal difficulty. Pt is without complaint of increased pain with work simulation activities. Strength and ORM are WNL. Pt comfortable with D/C to HEP. PT Plan: Recommend D/C to HEP.    Goals Home Exercise Program Pt/caregiver will Perform Home Exercise Program: For increased ROM;For increased strengthening PT Short Term Goals Time to Complete Short Term Goals: 2 weeks PT Short Term Goal 1: Pt ROM to be 0-130 to allow normal walking  PT Short Term Goal 1 - Progress: Met PT Short Term Goal 2: Pt to be able to squat to the floor to pick items up PT Short Term Goal 2 - Progress: Met PT Long Term Goals Time to Complete Long Term Goals: 4 weeks PT Long Term Goal 1: strength to be wnl to allow ladder and stair climbing PT Long Term Goal 1 - Progress: Met PT Long Term Goal 2: Pt to be able to return to work  PT Long Term Goal 2 - Progress: Progressing toward goal  Problem List Patient Active Problem List   Diagnosis Date Noted  . Stiffness of joint,  not elsewhere classified, lower leg 03/28/2013  . Leg weakness 03/28/2013  . S/P right knee arthroscopy 03/24/2013  . Medial meniscus, posterior horn derangement 03/24/2013  . Acute medial meniscus tear of left knee 01/12/2013  . Generalized anxiety disorder 10/08/2012  . Insomnia 10/08/2012  . GERD (gastroesophageal reflux disease) 10/01/2012  . ARTHROSCOPY, RIGHT KNEE, HX OF 07/03/2010  . ARTHRITIS, RIGHT KNEE 06/18/2010  . LOOSE  BODY-KNEE 06/18/2010  . OLD DISRUPTION OF ANTERIOR CRUCIATE LIGAMENT 06/18/2010  . DERANGEMENT OF POSTERIOR HORN OF MEDIAL MENISCUS 05/28/2010  . KNEE PAIN 05/28/2010    PT - End of Session Activity Tolerance: Patient tolerated treatment well General Behavior During Therapy: [facility] for tasks assessed/performed  Seth Bake, PTA  04/11/2013, 5:11 PM  Physician Documentation Your signature is required to indicate approval of the treatment plan as stated above.  Please sign and either send electronically or make a copy of this report for your files and return this physician signed original.   Please Tiger one 1.__approve of plan  2. ___approve of plan with the following conditions.   ______________________________                                                          _____________________ Physician Signature                                                                                                             Date

## 2013-04-13 ENCOUNTER — Ambulatory Visit (HOSPITAL_COMMUNITY): Payer: BC Managed Care – PPO | Admitting: *Deleted

## 2013-04-14 ENCOUNTER — Ambulatory Visit (INDEPENDENT_AMBULATORY_CARE_PROVIDER_SITE_OTHER): Payer: BC Managed Care – PPO | Admitting: Orthopedic Surgery

## 2013-04-14 ENCOUNTER — Encounter: Payer: Self-pay | Admitting: Orthopedic Surgery

## 2013-04-14 DIAGNOSIS — Z9889 Other specified postprocedural states: Secondary | ICD-10-CM

## 2013-04-14 DIAGNOSIS — M23329 Other meniscus derangements, posterior horn of medial meniscus, unspecified knee: Secondary | ICD-10-CM

## 2013-04-14 DIAGNOSIS — M23321 Other meniscus derangements, posterior horn of medial meniscus, right knee: Secondary | ICD-10-CM

## 2013-04-14 NOTE — Patient Instructions (Addendum)
Change RTW date to Apr 27, 2013   Continue home exercises

## 2013-04-14 NOTE — Progress Notes (Signed)
Patient ID: Samuel Henry, male   DOB: [birth year], 52 y.o.   MRN: [id number]  Chief Complaint  Patient presents with  . Follow-up    Right knee arthroscopy September 8    He is doing well after his knee arthroscopy therapy going well he felt a little popping sensation in the knee unrelated to his symptoms prior to surgery.  His knee has no effusion he walks well without a limp he has full range of motion active and passive negative McMurray's good quadriceps muscle tone no swelling at the ankle  Doing well discharge status post arthroscopy right knee followup as needed return to work on the 15th

## 2013-04-15 ENCOUNTER — Ambulatory Visit (HOSPITAL_COMMUNITY): Payer: BC Managed Care – PPO | Admitting: Physical Therapy

## 2013-06-26 ENCOUNTER — Other Ambulatory Visit: Payer: Self-pay | Admitting: Family Medicine

## 2013-08-05 ENCOUNTER — Other Ambulatory Visit: Payer: Self-pay | Admitting: Family Medicine

## 2013-08-05 NOTE — Telephone Encounter (Signed)
Ok plus 2 ref 

## 2013-09-02 ENCOUNTER — Ambulatory Visit (INDEPENDENT_AMBULATORY_CARE_PROVIDER_SITE_OTHER): Payer: BC Managed Care – PPO | Admitting: Family Medicine

## 2013-09-02 ENCOUNTER — Encounter: Payer: Self-pay | Admitting: Family Medicine

## 2013-09-02 VITALS — BP 128/88 | Temp 98.4°F | Ht 73.0 in | Wt 206.6 lb

## 2013-09-02 DIAGNOSIS — F411 Generalized anxiety disorder: Secondary | ICD-10-CM

## 2013-09-02 DIAGNOSIS — G4700 Insomnia, unspecified: Secondary | ICD-10-CM

## 2013-09-02 DIAGNOSIS — K219 Gastro-esophageal reflux disease without esophagitis: Secondary | ICD-10-CM

## 2013-09-02 DIAGNOSIS — H9319 Tinnitus, unspecified ear: Secondary | ICD-10-CM

## 2013-09-02 DIAGNOSIS — Z8669 Personal history of other diseases of the nervous system and sense organs: Secondary | ICD-10-CM

## 2013-09-02 MED ORDER — ONDANSETRON 4 MG PO TBDP: 4.0000 mg | ORAL_TABLET | Freq: Four times a day (QID) | ORAL | Status: DC | PRN

## 2013-09-02 MED ORDER — OMEPRAZOLE 20 MG PO CPDR: 40.0000 mg | DELAYED_RELEASE_CAPSULE | Freq: Every day | ORAL | Status: DC

## 2013-09-02 MED ORDER — DICLOFENAC SODIUM 75 MG PO TBEC: 75.0000 mg | DELAYED_RELEASE_TABLET | Freq: Four times a day (QID) | ORAL | Status: DC | PRN

## 2013-09-02 MED ORDER — BUPROPION HCL ER (SR) 150 MG PO TB12: 150.0000 mg | ORAL_TABLET | Freq: Two times a day (BID) | ORAL | Status: DC

## 2013-09-02 NOTE — Progress Notes (Signed)
   Subjective:    Patient ID: Samuel Henry, male    DOB: May 15, 1961, 53 y.o.   MRN: [id number]  Headache  This is a new problem. Episode frequency: about 2 a month. Associated symptoms comments: nausea. The symptoms are aggravated by bright light. He has tried Excedrin for the symptoms.   Having ringing in right ear. Saw Dr. Redmond Pulling in Jan 2015. They did a hearing test.  Pt has hx of occasional headaches. Noted sig ringing in the ears.hearing test revealed some hearing loss.  Mentioned neurologic basis to ringing. This has caused the patient to worry some.   Felt a headache coming on, got nausea, and light sensitive at times.  Gets the headaches around every two months.  On further history he gets a couple 3 difficult headaches per month. Because and sinus headache. Frontal in nature fairly severe. Often affects his ability to do what he once to do.  Had some tingling in the left hand with the most recent headache. No weakness no difficulty speech no tingling elsewhere.  Reports depression anxiety overall is stable.  Reports reflux is ongoing challenge. As long as he stays on medicines no significant symptoms with that. Dull aching behind the eyes. Often takes excecrin migraine.  Wondering if there is a connection between    Tingling in left hand   Patient requesting med check up also. He brought in bloodwork done at employee health.   Review of Systems  Neurological: Positive for headaches.   note abdominal pain no change in bowel habits no blood in stool no change in aeration no loss of consciousness no suicidal thoughts no homicidal thoughts.     Objective:   Physical Exam Alert no apparent distress. Lungs clear. Heart regular in rhythm. Abdomen benign. Ankles without edema. HEENT within normal limits. Neuro exam intact. Hands sensation intact strength intact pulses good.       Assessment & Plan:  Impression 1 migraine headaches discussed. Since the patient goes weeks  without headaches, feel primary prophylaxis is not warranted. In addition patient does not need imaging of brain at this time. #2 tinnitus discussed at great length. Patient likely will have to learn to live with it. #3 reflux clinically stable. #4 depression some worsening over Christmas now stable. Claims compliance with meds. #5 insomnia ongoing plan 35-40 minutes with patient most in discussion. Had Zofran when necessary for nausea. Had full tear and at first sign of severe headache. May use with over-the-counter agents. Maintain other medications. Diet exercise discussed. Will miss exams strongly encouraged at patient's age. WSL

## 2013-09-03 DIAGNOSIS — H9319 Tinnitus, unspecified ear: Secondary | ICD-10-CM | POA: Insufficient documentation

## 2013-09-03 DIAGNOSIS — Z8669 Personal history of other diseases of the nervous system and sense organs: Secondary | ICD-10-CM | POA: Insufficient documentation

## 2013-09-16 ENCOUNTER — Other Ambulatory Visit: Payer: Self-pay | Admitting: Family Medicine

## 2013-10-11 ENCOUNTER — Other Ambulatory Visit: Payer: Self-pay | Admitting: *Deleted

## 2013-10-11 MED ORDER — BUPROPION HCL ER (SR) 150 MG PO TB12: 150.0000 mg | ORAL_TABLET | Freq: Two times a day (BID) | ORAL | Status: DC

## 2013-12-17 ENCOUNTER — Other Ambulatory Visit: Payer: Self-pay | Admitting: Family Medicine

## 2014-03-03 ENCOUNTER — Emergency Department (HOSPITAL_COMMUNITY)
Admission: EM | Admit: 2014-03-03 | Discharge: 2014-03-03 | Disposition: A | Discharge status: Home / Self Care | Payer: BC Managed Care – PPO | Attending: Emergency Medicine | Admitting: Emergency Medicine

## 2014-03-03 ENCOUNTER — Emergency Department (HOSPITAL_COMMUNITY): Payer: BC Managed Care – PPO

## 2014-03-03 ENCOUNTER — Encounter (HOSPITAL_COMMUNITY): Payer: Self-pay | Admitting: Emergency Medicine

## 2014-03-03 ENCOUNTER — Telehealth: Payer: Self-pay | Admitting: Family Medicine

## 2014-03-03 DIAGNOSIS — F3289 Other specified depressive episodes: Secondary | ICD-10-CM | POA: Diagnosis not present

## 2014-03-03 DIAGNOSIS — K219 Gastro-esophageal reflux disease without esophagitis: Secondary | ICD-10-CM | POA: Diagnosis not present

## 2014-03-03 DIAGNOSIS — N200 Calculus of kidney: Secondary | ICD-10-CM | POA: Diagnosis not present

## 2014-03-03 DIAGNOSIS — F329 Major depressive disorder, single episode, unspecified: Secondary | ICD-10-CM | POA: Diagnosis not present

## 2014-03-03 DIAGNOSIS — R109 Unspecified abdominal pain: Secondary | ICD-10-CM | POA: Insufficient documentation

## 2014-03-03 DIAGNOSIS — Z79899 Other long term (current) drug therapy: Secondary | ICD-10-CM | POA: Diagnosis not present

## 2014-03-03 LAB — URINALYSIS, ROUTINE W REFLEX MICROSCOPIC
Glucose, UA: NEGATIVE mg/dL
KETONES UR: NEGATIVE mg/dL
Leukocytes, UA: NEGATIVE
NITRITE: NEGATIVE
PH: 5.5 (ref 5.0–8.0)
Protein, ur: 30 mg/dL — AB
Specific Gravity, Urine: >1.03 — ABNORMAL HIGH (ref 1.005–1.030)
Urobilinogen, UA: 0.2 mg/dL (ref 0.0–1.0)

## 2014-03-03 LAB — CBC WITH DIFFERENTIAL/PLATELET
Basophils Absolute: 0 10*3/uL (ref 0.0–0.1)
Basophils Relative: 1 % (ref 0–1)
Eosinophils Absolute: 0.1 10*3/uL (ref 0.0–0.7)
Eosinophils Relative: 2 % (ref 0–5)
HEMATOCRIT: 39.3 % (ref 39.0–52.0)
Hemoglobin: 13.6 g/dL (ref 13.0–17.0)
LYMPHS ABS: 0.7 10*3/uL (ref 0.7–4.0)
LYMPHS PCT: 13 % (ref 12–46)
MCH: 29.6 pg (ref 26.0–34.0)
MCHC: 34.6 g/dL (ref 30.0–36.0)
MCV: 85.6 fL (ref 78.0–100.0)
MONOS PCT: 8 % (ref 3–12)
Monocytes Absolute: 0.5 10*3/uL (ref 0.1–1.0)
NEUTROS ABS: 4.6 10*3/uL (ref 1.7–7.7)
NEUTROS PCT: 76 % (ref 43–77)
Platelets: 151 10*3/uL (ref 150–400)
RBC: 4.59 MIL/uL (ref 4.22–5.81)
RDW: 12.7 % (ref 11.5–15.5)
WBC: 5.9 10*3/uL (ref 4.0–10.5)

## 2014-03-03 LAB — URINE MICROSCOPIC-ADD ON

## 2014-03-03 LAB — COMPREHENSIVE METABOLIC PANEL
ALBUMIN: 4.2 g/dL (ref 3.5–5.2)
ALK PHOS: 92 U/L (ref 39–117)
ALT: 29 U/L (ref 0–53)
ANION GAP: 13 (ref 5–15)
AST: 23 U/L (ref 0–37)
BILIRUBIN TOTAL: 0.5 mg/dL (ref 0.3–1.2)
BUN: 17 mg/dL (ref 6–23)
CHLORIDE: 107 meq/L (ref 96–112)
CO2: 23 mEq/L (ref 19–32)
Calcium: 9 mg/dL (ref 8.4–10.5)
Creatinine, Ser: 1.41 mg/dL — ABNORMAL HIGH (ref 0.50–1.35)
GFR calc non Af Amer: 56 mL/min — ABNORMAL LOW (ref >90)
GFR, EST AFRICAN AMERICAN: 65 mL/min — AB (ref >90)
GLUCOSE: 113 mg/dL — AB (ref 70–99)
POTASSIUM: 3.8 meq/L (ref 3.7–5.3)
Sodium: 143 mEq/L (ref 137–147)
Total Protein: 6.9 g/dL (ref 6.0–8.3)

## 2014-03-03 MED ORDER — ONDANSETRON HCL 4 MG/2ML IJ SOLN
4.0000 mg | Freq: Once | INTRAMUSCULAR | Status: AC
  Administered 2014-03-03: 4 mg via INTRAVENOUS
  Filled 2014-03-03: qty 2

## 2014-03-03 MED ORDER — HYDROMORPHONE HCL PF 1 MG/ML IJ SOLN
0.5000 mg | Freq: Once | INTRAMUSCULAR | Status: AC
  Administered 2014-03-03: 0.5 mg via INTRAVENOUS
  Filled 2014-03-03: qty 1

## 2014-03-03 MED ORDER — KETOROLAC TROMETHAMINE 30 MG/ML IJ SOLN
30.0000 mg | Freq: Once | INTRAMUSCULAR | Status: AC
  Administered 2014-03-03: 30 mg via INTRAVENOUS
  Filled 2014-03-03: qty 1

## 2014-03-03 MED ORDER — PROMETHAZINE HCL 25 MG RE SUPP: 25.0000 mg | Freq: Four times a day (QID) | RECTAL | Status: DC | PRN

## 2014-03-03 MED ORDER — ONDANSETRON 4 MG PO TBDP: ORAL_TABLET | ORAL | Status: DC

## 2014-03-03 MED ORDER — HYDROMORPHONE HCL PF 1 MG/ML IJ SOLN
0.5000 mg | Freq: Once | INTRAMUSCULAR | Status: AC
  Administered 2014-03-03: 13:00:00 via INTRAVENOUS
  Filled 2014-03-03: qty 1

## 2014-03-03 MED ORDER — OXYCODONE-ACETAMINOPHEN 5-325 MG PO TABS: 1.0000 | ORAL_TABLET | Freq: Four times a day (QID) | ORAL | Status: DC | PRN

## 2014-03-03 MED ORDER — CIPROFLOXACIN HCL 500 MG PO TABS: 500.0000 mg | ORAL_TABLET | Freq: Two times a day (BID) | ORAL | Status: DC

## 2014-03-03 MED ORDER — TAMSULOSIN HCL 0.4 MG PO CAPS: 0.4000 mg | ORAL_CAPSULE | Freq: Every day | ORAL | Status: DC

## 2014-03-03 NOTE — Telephone Encounter (Signed)
Phen 25 twelve one q 4 6 ptn

## 2014-03-03 NOTE — ED Notes (Signed)
Left flank pain upon waking this morning with nausea.  Denies vomiting/hematuria.  Reports taking flomax and oxycodone 5mg  at 0800 this morning with no relief.

## 2014-03-03 NOTE — Discharge Instructions (Signed)
Follow up with [facility] next week.  Rest at home until seen.  Return to [facility] if problems this weekend

## 2014-03-03 NOTE — ED Provider Notes (Signed)
CSN: [id number]     Arrival date & time 03/03/14  1017 History  This chart was scribed for Samuel Diego, MD by Marlowe Kays, ED Scribe. This patient was seen in room APA14/[facility]14 and the patient's care was started at 11:40 AM.  Chief Complaint  Patient presents with  . Flank Pain   Patient is a 53 y.o. male presenting with flank pain. The history is provided by the patient. No language interpreter was used.  Flank Pain This is a recurrent problem. The current episode started 3 to 5 hours ago. The problem occurs constantly. The problem has not changed since onset.Pertinent negatives include no chest pain, no abdominal pain, no headaches and no shortness of breath. Nothing aggravates the symptoms. Nothing relieves the symptoms. Treatments tried: Oxycodone. The treatment provided no relief.   HPI Comments:  Samuel Henry is a 53 y.o. male with PMH of kidney stones who presents to the Emergency Department complaining of severe, sharp constant left-sided flank pain since waking this morning. Pt reports associated nausea. He states he has taken Oxycodone 5 mg approximately 2.5 hours ago with no relief. He states last time he saw his urologist was 1-2 years ago and was told he had one stone left but cannot recall which side. He also reports that he fell off his bed a few days ago but is doubtful that this is related. He denies vomiting, fever, chills.   Past Medical History  Diagnosis Date  . Depression   . Acid reflux   . History of kidney stones    Past Surgical History  Procedure Laterality Date  . Knee surgery Right 2010  . Knee arthroscopy with medial menisectomy Right 03/21/2013    Procedure: KNEE ARTHROSCOPY WITH MEDIAL MENISECTOMY;  Surgeon: Carole Civil, MD;  Location: [facility];  Service: Orthopedics;  Laterality: Right;  . Chondroplasty Right 03/21/2013    Procedure: MEDIAL AND PATELLAR CHONDROPLASTY;  Surgeon: Carole Civil, MD;  Location: [facility];  Service: Orthopedics;   Laterality: Right;   Family History  Problem Relation Age of Onset  . Hypertension Mother   . Hypertension Father   . Heart attack Father    History  Substance Use Topics  . Smoking status: Never Smoker   . Smokeless tobacco: Not on file  . Alcohol Use: No    Review of Systems  Constitutional: Negative for chills, appetite change and fatigue.  HENT: Negative for congestion, ear discharge and sinus pressure.   Eyes: Negative for discharge.  Respiratory: Negative for cough and shortness of breath.   Cardiovascular: Negative for chest pain.  Gastrointestinal: Positive for nausea. Negative for vomiting, abdominal pain and diarrhea.  Genitourinary: Positive for flank pain. Negative for frequency and hematuria.  Musculoskeletal: Negative for back pain.  Skin: Negative for rash.  Neurological: Negative for seizures and headaches.  Psychiatric/Behavioral: Negative for hallucinations.    Allergies  Nsaids  Home Medications   Prior to Admission medications   Medication Sig Start Date End Date Taking? Authorizing Provider  ALPRAZolam (XANAX) 1 MG tablet TAKE 1/2 TO 1 TABLET BY MOUTH AT BEDTIME A SNEEDED FOR INSOMA OR ANXIETY 08/05/13  Yes Mikey Kirschner, MD  buPROPion [facility] SR) 150 MG 12 hr tablet TAKE 1 TABLET TWICE A DAY   Yes Mikey Kirschner, MD  cetirizine (ZYRTEC) 10 MG tablet Take 10 mg by mouth daily.   Yes Historical Provider, MD  omeprazole (PRILOSEC) 20 MG capsule Take 2 capsules (40 mg total) by mouth  daily. 09/02/13  Yes Mikey Kirschner, MD  oxyCODONE (OXY IR/ROXICODONE) 5 MG immediate release tablet Take 5 mg by mouth every 4 (four) hours as needed for severe pain.   Yes Historical Provider, MD  tamsulosin (FLOMAX) 0.4 MG CAPS capsule Take 0.4 mg by mouth daily.   Yes Historical Provider, MD   Triage Vitals: BP 133/85  Pulse 70  Temp(Src) 97.7 F (36.5 C) (Oral)  Resp 18  Ht 6' (1.829 m)  Wt 205 lb (92.987 kg)  BMI 27.80 kg/m2  SpO2 100% Physical Exam   Constitutional: He is oriented to person, place, and time. He appears well-developed.  HENT:  Head: Normocephalic.  Eyes: Conjunctivae and EOM are normal. No scleral icterus.  Neck: Neck supple. No thyromegaly present.  Cardiovascular: Normal rate and regular rhythm.  Exam reveals no gallop and no friction rub.   No murmur heard. Pulmonary/Chest: No stridor. He has no wheezes. He has no rales. He exhibits no tenderness.  Abdominal: He exhibits no distension. There is no tenderness. There is CVA tenderness. There is no rebound.  Moderate left flank tenderness.  Musculoskeletal: Normal range of motion. He exhibits no edema.  Lymphadenopathy:    He has no cervical adenopathy.  Neurological: He is oriented to person, place, and time. He exhibits normal muscle tone. Coordination normal.  Skin: No rash noted. No erythema.  Psychiatric: He has a normal mood and affect. His behavior is normal.    ED Course  Procedures (including critical care time) DIAGNOSTIC STUDIES: Oxygen Saturation is 100% on RA, normal by my interpretation.   COORDINATION OF CARE: 10:33 AM- Will order pain and nausea medication, CT of abdomen and labs. Pt verbalizes understanding and agrees to plan.  Medications  ketorolac (TORADOL) 30 MG/ML injection 30 mg (30 mg Intravenous Given 03/03/14 1046)  HYDROmorphone (DILAUDID) injection 0.5 mg (0.5 mg Intravenous Given 03/03/14 1046)  ondansetron (ZOFRAN) injection 4 mg (4 mg Intravenous Given 03/03/14 1046)    Labs Review Labs Reviewed  COMPREHENSIVE METABOLIC PANEL - Abnormal; Notable for the following:    Glucose, Bld 113 (*)    Creatinine, Ser 1.41 (*)    GFR calc non Af Amer 56 (*)    GFR calc Af Amer 65 (*)    All other components within normal limits  URINALYSIS, ROUTINE W REFLEX MICROSCOPIC - Abnormal; Notable for the following:    Specific Gravity, Urine >1.030 (*)    Hgb urine dipstick TRACE (*)    Bilirubin Urine SMALL (*)    Protein, ur 30 (*)    All  other components within normal limits  URINE MICROSCOPIC-ADD ON - Abnormal; Notable for the following:    Bacteria, UA MANY (*)    All other components within normal limits  CBC WITH DIFFERENTIAL    Imaging Review Ct Abdomen Pelvis Wo Contrast  03/03/2014   CLINICAL DATA:  Left flank pain  EXAM: CT ABDOMEN AND PELVIS WITHOUT CONTRAST  TECHNIQUE: Multidetector CT imaging of the abdomen and pelvis was performed following the standard protocol without IV contrast.  COMPARISON:  02/06/2011  FINDINGS: Moderate left hydronephrosis and perinephric stranding. 6 mm calculus at the left ureteropelvic junction. Multiple small calculi in the collecting system of the left kidney. No right ureteral or renal calculus.  Unenhanced liver, gallbladder, spleen, pancreas, adrenal glands are within normal limits.  Normal appendix.  Unremarkable bladder. Stable prostate enlargement. Bilateral adipose proliferation in external inguinal canals.  Degenerative changes in the lumbar spine. Severe narrowing of the L5-S1  disc. Lateral recess narrowing at L3-4 and L4-5.  IMPRESSION: Left hydronephrosis associated with a proximal 6 mm left ureteral calculus.  Left nephrolithiasis.   Electronically Signed   By: Maryclare Bean M.D.   On: 03/03/2014 11:36     EKG Interpretation None      MDM   Final diagnoses:  None    Kidney stone.   Will follow up with urology next week.  I personally performed the services described in this documentation, which was scribed in my presence. The recorded information has been reviewed and is accurate.    Samuel Diego, MD 03/03/14 [phone]

## 2014-03-03 NOTE — Telephone Encounter (Signed)
Rx sent electronically to pharmacy. Patient notified. 

## 2014-03-03 NOTE — ED Notes (Signed)
In to discharge pt. Request more pain medication before discharge. EDP aware

## 2014-03-03 NOTE — Telephone Encounter (Signed)
Pt has a kidney stone, he can not get into the Urologist till Monday He is extremely nauseated an was issued zofran but it's not working  pts spouse would like to know if we can call him in some phenergan suppository?  [facility] stated [facility] had to ask his pcp for this. Please call into [facility]  Has all over pain meds filled already from the ER doc

## 2014-03-06 ENCOUNTER — Ambulatory Visit: Payer: BC Managed Care – PPO | Admitting: Family Medicine

## 2014-03-06 ENCOUNTER — Other Ambulatory Visit: Payer: Self-pay | Admitting: Urology

## 2014-03-07 ENCOUNTER — Encounter (HOSPITAL_COMMUNITY): Payer: Self-pay | Admitting: General Practice

## 2014-03-10 ENCOUNTER — Encounter (HOSPITAL_COMMUNITY): Payer: Self-pay | Admitting: Pharmacy Technician

## 2014-03-10 NOTE — H&P (Signed)
History of Present Illness Consultation for left proximal ureteral stone referred by Dr. Roderic Palau. His PCP is Dr. Wolfgang Phoenix. Patient has a history of kidney stones and passed several stones. Dr. Diona Fanti discussed dietary changes to help prevent stones after the patient's last stone passage in 2013. He had no other stones at that time. About 3 days ago he developed the sudden onset of severe left flank pain. CT of the abdomen and pelvis was obtained which showed a 6 x 10 cm left proximal ureteral stone. His white count was normal at 5.9, BUN 17, creatinine 1.4 which was up slightly. UA within 3-6 reds, 3-6 whites and many bacteria. I did not see urine culture. He was discharged after his pain was controlled. I reviewed the CT scan images. There were no other stones. The skin to stone distance was about 13 cm.    Today, his pain has been under control. He's been eating and drinking normally. He has not been taking NSAIDs but he did eat and drink this morning.   Past Medical History Problems  1. History of depression (V11.8) 2. History of esophageal reflux (V12.79) 3. History of kidney stones (V13.01) 4. History of kidney stones (V13.01)  Surgical History Problems  1. History of Knee Surgery Right  Current Meds 1. ALPRAZolam 0.5 MG Oral Tablet;  Therapy: (Recorded:20Aug2013) to Recorded 2. BuPROPion HCl ER (SR) 150 MG Oral Tablet Extended Release 12 Hour;  Therapy: (Recorded:20Aug2013) to Recorded 3. Cetirizine HCl - 10 MG Oral Tablet;  Therapy: (Recorded:24Aug2015) to Recorded 4. Omeprazole 20 MG Oral Capsule Delayed Release;  Therapy: (Recorded:20Aug2013) to Recorded 5. OxyCODONE HCl - 5 MG Oral Tablet;  Therapy: (Recorded:24Aug2015) to Recorded 6. Promethazine HCl - 25 MG Oral Tablet;  Therapy: (Recorded:24Aug2015) to Recorded 7. Tamsulosin HCl - 0.4 MG Oral Capsule;  Therapy: (Recorded:24Aug2015) to Recorded  Allergies Medication  1. NSAIDs  Family History Problems  1. Family  history of Family Health Status Number Of Children : Father 2. Family history of Nephrolithiasis : Father 3. Family history of Prostate Cancer (D62.22) : Father  Social History Problems    Denied: History of Alcohol Use   Caffeine Use   Marital History - Currently Married   Never A Smoker   Number of children   Occupation:  Review of Systems Genitourinary, constitutional, skin, eye, otolaryngeal, hematologic/lymphatic, cardiovascular, pulmonary, endocrine, musculoskeletal, gastrointestinal, neurological and psychiatric system(s) were reviewed and pertinent findings if present are noted.  Gastrointestinal: nausea, vomiting and constipation.  Constitutional: night sweats.  Integumentary: pruritus.  Musculoskeletal: back pain.    Vitals Vital Signs [Data Includes: Last 1 Day]  Recorded: 24Aug2015 10:56AM  Blood Pressure: 116 / 76 Temperature: 98.3 F Heart Rate: 69  Physical Exam Constitutional: Well nourished and well developed . No acute distress.  Skin: Normal skin turgor, no visible rash and no visible skin lesions.    Results/Data Urine [Data Includes: Last 1 Day]   24Aug2015  COLOR YELLOW   APPEARANCE CLEAR   SPECIFIC GRAVITY 1.020   pH 5.5   GLUCOSE NEG mg/dL  BILIRUBIN NEG   KETONE NEG mg/dL  BLOOD SMALL   PROTEIN NEG mg/dL  UROBILINOGEN 0.2 mg/dL  NITRITE NEG   LEUKOCYTE ESTERASE NEG   SQUAMOUS EPITHELIAL/HPF RARE   WBC 0-2 WBC/hpf  RBC 0-2 RBC/hpf  BACTERIA RARE   CRYSTALS NONE SEEN   CASTS NONE SEEN   Other MUCUS NOTED    Old records or history reviewed:Marland Kitchen  The following images/tracing/specimen were independently visualized: .  Assessment Assessed  1. Calculus of left ureter (592.1)  Plan Calculus of left ureter  1. Follow-up Schedule Surgery Office  Follow-up  Status: Complete  Done: 24Aug2015 Calculus of left ureter, Calculus of right ureter  2. KUB; Status:Resulted - Requires Verification;   Done: [id number] 12:00AM Health Maintenance   3. UA With REFLEX; [Do Not Release]; Status:Resulted - Requires Verification;   Done:  [id number] 10:03AM  Discussion/Summary   Left proximal ureteral stone-I reviewed the KUB CT scan images with the patient. We discussed the nature risk and benefits of continued stone passage was off label alpha-blocker use, shockwave lithotripsy for left ureteroscopy with holmium laser lithotripsy and stent placement. All questions answered. He elected to proceed with shockwave lithotripsy. We will set this up for the near future. I told him to call should he get any fevers chills nausea vomiting or uncontrolled pain.     Signatures Electronically signed by : [name], M.D.; Mar 06 2014 12:26PM EST

## 2014-03-13 ENCOUNTER — Encounter (HOSPITAL_COMMUNITY): Payer: Self-pay | Admitting: *Deleted

## 2014-03-13 ENCOUNTER — Encounter (HOSPITAL_COMMUNITY)
Admission: RE | Disposition: A | Discharge status: Home / Self Care | Payer: Self-pay | Source: Ambulatory Visit | Attending: Urology

## 2014-03-13 ENCOUNTER — Ambulatory Visit (HOSPITAL_COMMUNITY)
Admission: RE | Admit: 2014-03-13 | Discharge: 2014-03-13 | Disposition: A | Discharge status: Home / Self Care | Payer: BC Managed Care – PPO | Source: Ambulatory Visit | Attending: Urology | Admitting: Urology

## 2014-03-13 ENCOUNTER — Ambulatory Visit (HOSPITAL_COMMUNITY): Payer: BC Managed Care – PPO

## 2014-03-13 DIAGNOSIS — N201 Calculus of ureter: Secondary | ICD-10-CM | POA: Insufficient documentation

## 2014-03-13 DIAGNOSIS — F3289 Other specified depressive episodes: Secondary | ICD-10-CM | POA: Insufficient documentation

## 2014-03-13 DIAGNOSIS — F329 Major depressive disorder, single episode, unspecified: Secondary | ICD-10-CM | POA: Diagnosis not present

## 2014-03-13 DIAGNOSIS — K219 Gastro-esophageal reflux disease without esophagitis: Secondary | ICD-10-CM | POA: Diagnosis not present

## 2014-03-13 SURGERY — LITHOTRIPSY, ESWL
Anesthesia: LOCAL | Laterality: Left

## 2014-03-13 MED ORDER — DIPHENHYDRAMINE HCL 25 MG PO CAPS
25.0000 mg | ORAL_CAPSULE | ORAL | Status: AC
  Administered 2014-03-13: 25 mg via ORAL
  Filled 2014-03-13: qty 1

## 2014-03-13 MED ORDER — DIAZEPAM 5 MG PO TABS
10.0000 mg | ORAL_TABLET | ORAL | Status: AC
  Administered 2014-03-13: 10 mg via ORAL
  Filled 2014-03-13: qty 2

## 2014-03-13 MED ORDER — CIPROFLOXACIN HCL 500 MG PO TABS
500.0000 mg | ORAL_TABLET | ORAL | Status: AC
  Administered 2014-03-13: 500 mg via ORAL
  Filled 2014-03-13: qty 1

## 2014-03-13 MED ORDER — TAMSULOSIN HCL 0.4 MG PO CAPS: 0.4000 mg | ORAL_CAPSULE | Freq: Every day | ORAL | Status: DC

## 2014-03-13 MED ORDER — OXYCODONE-ACETAMINOPHEN 5-325 MG PO TABS: 1.0000 | ORAL_TABLET | ORAL | Status: DC | PRN

## 2014-03-13 MED ORDER — SODIUM CHLORIDE 0.9 % IV SOLN
INTRAVENOUS | Status: DC
  Administered 2014-03-13: 16:00:00 via INTRAVENOUS

## 2014-03-13 NOTE — Discharge Instructions (Signed)

## 2014-03-13 NOTE — Op Note (Signed)
See [facility] scanned op note -  Left ESWL  Left proximal ureteral stone - 10 mm

## 2014-03-13 NOTE — Interval H&P Note (Signed)
History and Physical Interval Note:  03/13/2014 5:14 PM  Samuel Henry  has presented today for surgery, with the diagnosis of left proximal ureteral stone  The various methods of treatment have been discussed with the patient and family. After consideration of risks, benefits and other options for treatment, the patient has consented to  Procedure(s): LEFT EXTRACORPOREAL SHOCK WAVE LITHOTRIPSY (ESWL) (Left) as a surgical intervention .  The patient's history has been reviewed, patient examined, no change in status, stable for surgery.  I have reviewed the patient's chart and labs.  Questions were answered to the patient's satisfaction.   Stone stable on KUB - left proximal. No dysuria, fever or gross hematuria. No other significant stone on CT - discussed with patient. He elects to proceed.   [name]

## 2014-03-21 ENCOUNTER — Other Ambulatory Visit: Payer: Self-pay | Admitting: Family Medicine

## 2014-03-27 ENCOUNTER — Other Ambulatory Visit: Payer: Self-pay | Admitting: Family Medicine

## 2014-03-27 NOTE — Telephone Encounter (Signed)
Last seen 08/2013

## 2014-03-27 NOTE — Telephone Encounter (Signed)
One mo more needs 6 mo appt over one mo ago

## 2014-03-29 ENCOUNTER — Ambulatory Visit: Payer: BC Managed Care – PPO | Admitting: Family Medicine

## 2014-06-20 ENCOUNTER — Encounter (HOSPITAL_COMMUNITY): Payer: Self-pay | Admitting: *Deleted

## 2014-06-20 ENCOUNTER — Emergency Department (HOSPITAL_COMMUNITY)
Admission: EM | Admit: 2014-06-20 | Discharge: 2014-06-20 | Disposition: A | Discharge status: Home / Self Care | Payer: BC Managed Care – PPO | Attending: Emergency Medicine | Admitting: Emergency Medicine

## 2014-06-20 DIAGNOSIS — N201 Calculus of ureter: Secondary | ICD-10-CM | POA: Diagnosis not present

## 2014-06-20 DIAGNOSIS — K219 Gastro-esophageal reflux disease without esophagitis: Secondary | ICD-10-CM | POA: Insufficient documentation

## 2014-06-20 DIAGNOSIS — F329 Major depressive disorder, single episode, unspecified: Secondary | ICD-10-CM | POA: Insufficient documentation

## 2014-06-20 DIAGNOSIS — R1032 Left lower quadrant pain: Secondary | ICD-10-CM | POA: Diagnosis present

## 2014-06-20 DIAGNOSIS — Z792 Long term (current) use of antibiotics: Secondary | ICD-10-CM | POA: Insufficient documentation

## 2014-06-20 DIAGNOSIS — Z79899 Other long term (current) drug therapy: Secondary | ICD-10-CM | POA: Insufficient documentation

## 2014-06-20 DIAGNOSIS — Z87442 Personal history of urinary calculi: Secondary | ICD-10-CM | POA: Insufficient documentation

## 2014-06-20 LAB — URINALYSIS, ROUTINE W REFLEX MICROSCOPIC
Bilirubin Urine: NEGATIVE
Glucose, UA: NEGATIVE mg/dL
Ketones, ur: NEGATIVE mg/dL
Leukocytes, UA: NEGATIVE
Nitrite: NEGATIVE
Protein, ur: NEGATIVE mg/dL
Specific Gravity, Urine: 1.02 (ref 1.005–1.030)
Urobilinogen, UA: 0.2 mg/dL (ref 0.0–1.0)
pH: 7 (ref 5.0–8.0)

## 2014-06-20 LAB — URINE MICROSCOPIC-ADD ON

## 2014-06-20 MED ORDER — PROMETHAZINE HCL 25 MG/ML IJ SOLN
25.0000 mg | Freq: Once | INTRAMUSCULAR | Status: AC
  Administered 2014-06-20: 25 mg via INTRAVENOUS
  Filled 2014-06-20: qty 1

## 2014-06-20 MED ORDER — HYDROMORPHONE HCL 1 MG/ML IJ SOLN
1.0000 mg | Freq: Once | INTRAMUSCULAR | Status: AC
  Administered 2014-06-20: 1 mg via INTRAVENOUS
  Filled 2014-06-20: qty 1

## 2014-06-20 MED ORDER — TAMSULOSIN HCL 0.4 MG PO CAPS: ORAL_CAPSULE | ORAL | Status: DC

## 2014-06-20 MED ORDER — TAMSULOSIN HCL 0.4 MG PO CAPS
0.4000 mg | ORAL_CAPSULE | Freq: Once | ORAL | Status: AC
Start: 2014-06-20 — End: 2014-06-20
  Administered 2014-06-20: 0.4 mg via ORAL
  Filled 2014-06-20: qty 1

## 2014-06-20 MED ORDER — SODIUM CHLORIDE 0.9 % IV SOLN
INTRAVENOUS | Status: DC
  Administered 2014-06-20: 03:00:00 via INTRAVENOUS

## 2014-06-20 MED ORDER — KETOROLAC TROMETHAMINE 30 MG/ML IJ SOLN
30.0000 mg | Freq: Once | INTRAMUSCULAR | Status: AC
  Administered 2014-06-20: 30 mg via INTRAVENOUS
  Filled 2014-06-20: qty 1

## 2014-06-20 MED ORDER — ONDANSETRON HCL 4 MG/2ML IJ SOLN
4.0000 mg | Freq: Once | INTRAMUSCULAR | Status: AC
  Administered 2014-06-20: 4 mg via INTRAMUSCULAR
  Filled 2014-06-20: qty 2

## 2014-06-20 MED ORDER — PROMETHAZINE HCL 25 MG RE SUPP: 25.0000 mg | Freq: Four times a day (QID) | RECTAL | Status: DC | PRN

## 2014-06-20 NOTE — ED Provider Notes (Signed)
CSN: [id number]     Arrival date & time 06/20/14  0207 History   First MD Initiated Contact with Patient 06/20/14 0232     Chief Complaint  Patient presents with  . Abdominal Pain     (Consider location/radiation/quality/duration/timing/severity/associated sxs/prior Treatment) HPI  Patient reports about 11 PM he started having severe lower abdominal pain that may radiate a little bit to his left lower quadrant. He states he has a lot of pain in his testicles but denies swelling in his testicles. He denies any back or flank pain today or the last couple days. He has had nausea and vomiting tried to take an oxycodone for pain tonight but he vomited it. He states he's had this pain before kidney stones. He denies hematuria or feeling of urinary retention. He states nothing he does makes the pain feel worse, nothing he does makes the pain feel better. Patient states he had lithotripsy in the summer by Dr. Junious Silk. At that time he had a 6 mm proximal ureteral stone on the left. CT scan done at that time shows he has multiple small stones on the left.   PCP Dr Wolfgang [facility] Dr Junious Silk  Past Medical History  Diagnosis Date  . Depression   . Acid reflux   . History of kidney stones    Past Surgical History  Procedure Laterality Date  . Knee surgery Right 2010  . Knee arthroscopy with medial menisectomy Right 03/21/2013    Procedure: KNEE ARTHROSCOPY WITH MEDIAL MENISECTOMY;  Surgeon: Carole Civil, MD;  Location: [facility];  Service: Orthopedics;  Laterality: Right;  . Chondroplasty Right 03/21/2013    Procedure: MEDIAL AND PATELLAR CHONDROPLASTY;  Surgeon: Carole Civil, MD;  Location: [facility];  Service: Orthopedics;  Laterality: Right;   Family History  Problem Relation Age of Onset  . Hypertension Mother   . Hypertension Father   . Heart attack Father    History  Substance Use Topics  . Smoking status: Never Smoker   . Smokeless tobacco: Not on file  . Alcohol Use: No  lives  at home  Lives with spouse  Review of Systems  All other systems reviewed and are negative.     Allergies  Nsaids  Home Medications   Prior to Admission medications   Medication Sig Start Date End Date Taking? Authorizing Provider  ALPRAZolam Duanne Moron) 1 MG tablet take 1/2-1 tablet at bedtime if needed 03/28/14   Mikey Kirschner, MD  buPROPion [facility] SR) 150 MG 12 hr tablet Take 150 mg by mouth 2 (two) times daily.    Historical Provider, MD  cetirizine (ZYRTEC) 10 MG tablet Take 10 mg by mouth daily.    Historical Provider, MD  ciprofloxacin (CIPRO) 500 MG tablet Take 500 mg by mouth 2 (two) times daily.    Historical Provider, MD  omeprazole (PRILOSEC) 20 MG capsule Take 20 mg by mouth daily.    Historical Provider, MD  omeprazole (PRILOSEC) 20 MG capsule TAKE 2 CAPSULES DAILY 03/21/14   Mikey Kirschner, MD  oxyCODONE-acetaminophen (PERCOCET/ROXICET) 5-325 MG per tablet Take 1 tablet by mouth every 6 (six) hours as needed for moderate pain or severe pain.    Historical Provider, MD  oxyCODONE-acetaminophen (ROXICET) 5-325 MG per tablet Take 1-2 tablets by mouth every 4 (four) hours as needed for severe pain. 03/13/14   [name], MD  promethazine (PHENERGAN) 25 MG suppository Place 25 mg rectally every 6 (six) hours as needed for nausea or vomiting.  Historical Provider, MD  tamsulosin (FLOMAX) 0.4 MG CAPS capsule Take 0.4 mg by mouth daily.    Historical Provider, MD  tamsulosin (FLOMAX) 0.4 MG CAPS capsule Take 1 capsule (0.4 mg total) by mouth daily after supper. 03/13/14   [name], MD   BP 125/74 mmHg  Pulse 83  Temp(Src) 98 F (36.7 C) (Oral)  Resp 18  Ht 6' (1.829 m)  Wt 200 lb (90.719 kg)  BMI 27.12 kg/m2  SpO2 100%  Vital signs normal   Physical Exam  Constitutional: He is oriented to person, place, and time. He appears well-developed and well-nourished.  Non-toxic appearance. He does not appear ill. He appears distressed.  HENT:  Head:  Normocephalic and atraumatic.  Right Ear: External ear normal.  Left Ear: External ear normal.  Nose: Nose normal. No mucosal edema or rhinorrhea.  Mouth/Throat: Oropharynx is clear and moist and mucous membranes are normal. No dental abscesses or uvula swelling.  Eyes: Conjunctivae and EOM are normal. Pupils are equal, round, and reactive to light.  Neck: Normal range of motion and full passive range of motion without pain. Neck supple.  Cardiovascular: Normal rate, regular rhythm and normal heart sounds.  Exam reveals no gallop and no friction rub.   No murmur heard. Pulmonary/Chest: Effort normal and breath sounds normal. No respiratory distress. He has no wheezes. He has no rhonchi. He has no rales. He exhibits no tenderness and no crepitus.  Abdominal: Soft. Normal appearance and bowel sounds are normal. He exhibits no distension. There is no tenderness. There is no rebound and no guarding.  Patient indicates he's having a lot of pain in his suprapubic area.  Genitourinary:  Patient has normal external genitalia, his testicles are not enlarged and are not tender to palpation. Chaperone was present Patient has no CVA tenderness  Musculoskeletal: Normal range of motion. He exhibits no edema or tenderness.  Moves all extremities well.   Neurological: He is alert and oriented to person, place, and time. He has normal strength. No cranial nerve deficit.  Skin: Skin is warm, dry and intact. No rash noted. No erythema. No pallor.  Psychiatric: He has a normal mood and affect. His speech is normal and behavior is normal. His mood appears not anxious.  Nursing note and vitals reviewed.   ED Course  Procedures (including critical care time)  Medications  0.9 %  sodium chloride infusion ( Intravenous Stopped 06/20/14 0436)  HYDROmorphone (DILAUDID) injection 1 mg (1 mg Intravenous Given 06/20/14 0256)  ondansetron (ZOFRAN) injection 4 mg (4 mg Intramuscular Given 06/20/14 0256)  promethazine  (PHENERGAN) injection 25 mg (25 mg Intravenous Given 06/20/14 0318)  HYDROmorphone (DILAUDID) injection 1 mg (1 mg Intravenous Given 06/20/14 0424)  ketorolac (TORADOL) 30 MG/ML injection 30 mg (30 mg Intravenous Given 06/20/14 0424)  tamsulosin (FLOMAX) capsule 0.4 mg (0.4 mg Oral Given 06/20/14 0424)   Patient continued to have nausea with Zofran. He was given IV Phenergan per his request.   Recheck at 0400 patient appears much more comfortable. He still is having some pain in the LLQ but no longer in his testicles. He states he has plenty of oxycodone at home, however he needs more Flomax. Although it's listed he has an allergy to nonsteroidal anti-inflammatory drugs this was an episode of severe esophagitis 5 years ago from taking over-the-counter Motrin. His wife states he has taken IV Toradol in the past. Given IV Toradol and started on oral Flomax now that his nausea is controlled. CT scan  was not done today since he just had one in August. I would not expect him to form a very large stone in that short a period of time. Plus with his pain location it appears the stone is close to the UVJ.   Labs Review Results for orders placed or performed during the hospital encounter of 06/20/14  Urinalysis, Routine w reflex microscopic  Result Value Ref Range   Color, Urine YELLOW YELLOW   APPearance CLEAR CLEAR   Specific Gravity, Urine 1.020 1.005 - 1.030   pH 7.0 5.0 - 8.0   Glucose, UA NEGATIVE NEGATIVE mg/dL   Hgb urine dipstick TRACE (A) NEGATIVE   Bilirubin Urine NEGATIVE NEGATIVE   Ketones, ur NEGATIVE NEGATIVE mg/dL   Protein, ur NEGATIVE NEGATIVE mg/dL   Urobilinogen, UA 0.2 0.0 - 1.0 mg/dL   Nitrite NEGATIVE NEGATIVE   Leukocytes, UA NEGATIVE NEGATIVE  Urine microscopic-add on  Result Value Ref Range   Squamous Epithelial / LPF RARE RARE   WBC, UA 0-2 <3 WBC/hpf   RBC / HPF 0-2 <3 RBC/hpf   Bacteria, UA FEW (A) RARE   Laboratory interpretation all normal except microscopic  hematuria     Imaging Review No results found.   EKG Interpretation None      MDM   Final diagnoses:  Left ureteral stone    New Prescriptions   PROMETHAZINE (PHENERGAN) 25 MG SUPPOSITORY    Place 1 suppository (25 mg total) rectally every 6 (six) hours as needed for nausea or vomiting.   TAMSULOSIN (FLOMAX) 0.4 MG CAPS CAPSULE    Take 1 po QD until you pass the stone.    Plan discharge  Rolland Porter, MD, Alanson Aly, MD 06/20/14 [phone]

## 2014-06-20 NOTE — Discharge Instructions (Signed)
Drink plenty of fluids. Take your pain medications as needed. Take the flomax until you pass the stone. Let Dr Junious Silk know if you haven't passed the stone in the next couple of days. If you get a fever or have uncontrolled vomiting or pain, go to [facility] Emergency Department so the urologist can see you there.

## 2014-06-20 NOTE — ED Notes (Signed)
Pt reporting pain in LLQ beginning about 2 hours ago.  Associated nausea and vomiting.  Pt states he did notice dull ache a couple days ago but pain sharpened tonight.  Reports history of kidney stones and states pain is similar.

## 2015-01-24 ENCOUNTER — Encounter: Payer: Self-pay | Admitting: Family Medicine

## 2015-01-24 ENCOUNTER — Ambulatory Visit (INDEPENDENT_AMBULATORY_CARE_PROVIDER_SITE_OTHER): Payer: BLUE CROSS/BLUE SHIELD | Admitting: Family Medicine

## 2015-01-24 VITALS — BP 118/82 | Ht 73.0 in | Wt 206.2 lb

## 2015-01-24 DIAGNOSIS — F411 Generalized anxiety disorder: Secondary | ICD-10-CM | POA: Diagnosis not present

## 2015-01-24 DIAGNOSIS — K219 Gastro-esophageal reflux disease without esophagitis: Secondary | ICD-10-CM

## 2015-01-24 DIAGNOSIS — J00 Acute nasopharyngitis [common cold]: Secondary | ICD-10-CM

## 2015-01-24 DIAGNOSIS — G4700 Insomnia, unspecified: Secondary | ICD-10-CM | POA: Diagnosis not present

## 2015-01-24 DIAGNOSIS — J209 Acute bronchitis, unspecified: Secondary | ICD-10-CM

## 2015-01-24 MED ORDER — OMEPRAZOLE 20 MG PO CPDR: 20.0000 mg | DELAYED_RELEASE_CAPSULE | Freq: Every day | ORAL | Status: DC

## 2015-01-24 MED ORDER — BUPROPION HCL ER (SR) 150 MG PO TB12: 150.0000 mg | ORAL_TABLET | Freq: Two times a day (BID) | ORAL | Status: DC

## 2015-01-24 MED ORDER — CEFDINIR 300 MG PO CAPS: 300.0000 mg | ORAL_CAPSULE | Freq: Two times a day (BID) | ORAL | Status: DC

## 2015-01-24 MED ORDER — ALPRAZOLAM 1 MG PO TABS: ORAL_TABLET | ORAL | Status: DC

## 2015-01-24 NOTE — Progress Notes (Signed)
   Subjective:    Patient ID: Samuel Henry, male    DOB: 1960-08-08, 54 y.o.   MRN: [id number]  Anxiety Presents for follow-up visit. The problem has been gradually improving. Symptoms occur occasionally. Nothing aggravates the symptoms. The quality of sleep is fair.   There are no known risk factors.   Patient states that he has some congestion in his chest/throat that has been present for about 1 month and he would like to talk with the doctor about this.   Taking the prilosec regulaly. Ongoing reflux, unable to go without the medicine. No trouble swallowing and cough and congestion, at times gets some secretions up wth cough. Pt has had a cough with it.  Patient does have some cough and congestion productive at times.   No obv assoc with meals  Taking the generic wellbutrin regularly, overall helps chronic anxiety.  Not exercising at all these days.  Takes the xanax at night as needded. Does not use very regularly but definitely helps insomnia 1 it's a problem.  Review of Systems No headache no chest pain no back pain    Objective:   Physical Exam  Alert vitals stable HEENT slight nasal congestion frontal neck supple. Lungs clear. Heart regular in rhythm. Abdomen benign.      Assessment & Plan:  Impression #1 insomnia discussed needs met #2 reflux intermittent definitely needs meds #3 subacute bronchitis discussed #4 chronic anxiety plan all medications refilled. Patient brings in blood work through the workplace. We will scan. I pointed out to patient this did not include PSA. He states has been checked in the past. Patient declines physical wellness exam. Declines further assessment of prostate colon Etc. Recheck every 6 months. WSL

## 2015-02-28 ENCOUNTER — Telehealth: Payer: Self-pay

## 2015-02-28 NOTE — Telephone Encounter (Signed)
Note from Dr. Gala Romney to triage for EGD. Left message with Pamala Hurry to have [name] call me.

## 2015-03-02 ENCOUNTER — Encounter: Payer: Self-pay | Admitting: Internal Medicine

## 2015-03-02 NOTE — Telephone Encounter (Signed)
LMOM cell and home number for pt.

## 2015-03-02 NOTE — Telephone Encounter (Signed)
Tried to call multiple exts at the hospital to speak to [facility] and no answer.

## 2015-03-06 NOTE — Telephone Encounter (Signed)
Letter mailed to pt to call to be triaged for this procedure.

## 2015-04-12 ENCOUNTER — Other Ambulatory Visit: Payer: Self-pay | Admitting: Family Medicine

## 2015-11-13 ENCOUNTER — Encounter: Payer: Self-pay | Admitting: Family Medicine

## 2015-11-13 ENCOUNTER — Ambulatory Visit (INDEPENDENT_AMBULATORY_CARE_PROVIDER_SITE_OTHER): Payer: BLUE CROSS/BLUE SHIELD | Admitting: Family Medicine

## 2015-11-13 VITALS — BP 128/84 | Temp 99.3°F | Ht 73.0 in | Wt 201.0 lb

## 2015-11-13 DIAGNOSIS — R509 Fever, unspecified: Secondary | ICD-10-CM

## 2015-11-13 DIAGNOSIS — R109 Unspecified abdominal pain: Secondary | ICD-10-CM | POA: Diagnosis not present

## 2015-11-13 LAB — POCT URINALYSIS DIPSTICK
SPEC GRAV UA: 1.015
pH, UA: 5

## 2015-11-13 MED ORDER — DOXYCYCLINE HYCLATE 100 MG PO TABS: 100.0000 mg | ORAL_TABLET | Freq: Two times a day (BID) | ORAL | Status: DC

## 2015-11-13 NOTE — Progress Notes (Signed)
   Subjective:    Patient ID: Samuel Henry, male    DOB: 03-18-1961, 55 y.o.   MRN: [id number]  Abdominal Pain This is a new problem. Episode onset: off and on for one month. He has tried acetaminophen (zofran) for the symptoms.   Fatigue for about 3 days.   Temp running the pas past couple days  Not as much appetite, felt cold  t i 101.3  Pt took tylenol   Felt bad, achey  Pt struck in left loer quadrant, dull pain , at times, wondered if assosicat with h of kid stones Hard pop to ll q  . This occurred a month ago. Has had tenderness off-and-on since. Was worried about potential for a hernia also. Wonders if his fever the last couple days is somehow associated  Nausea, fever, headache. Started today.   Too k zofran  No sig urinary habit chnges   No ticks this yr,mild h a, disffuse in nature  Results for orders placed or performed in visit on 11/13/15  POCT urinalysis dipstick  Result Value Ref Range   Color, UA     Clarity, UA     Glucose, UA     Bilirubin, UA     Ketones, UA     Spec Grav, UA 1.015    Blood, UA     pH, UA 5.0    Protein, UA     Urobilinogen, UA     Nitrite, UA     Leukocytes, UA  Negative     Review of Systems  Gastrointestinal: Positive for abdominal pain.  No headache, no major weight loss or weight gain, no chest pain no back pain no change in bowel habits complete ROS otherwise negative      Objective:   Physical Exam  Alert anxious appearing HEENT normal neck supple blood pressure good on repeat lungs clear. Heart rare rhythm left lower quadrant slight tenderness to deep palpation no rebound no guarding scrotal exam no masses no CVA tenderness urinalysis unremarkable      Assessment & Plan:  Impression 1 viral syndrome syndrome highly likely this is a diagnosis discussed, however advised patient fever an element of headache we need to place him on doxycycline due to low risk of tick illness #2 abdominal pain subacute. Unrelated to  #1. Doubt any substantial injury discussed plan easily 25 minutes spent most in discussion symptom care discussed warning signs discussed WSL

## 2015-11-15 ENCOUNTER — Ambulatory Visit: Payer: BLUE CROSS/BLUE SHIELD | Admitting: Family Medicine

## 2015-11-15 ENCOUNTER — Telehealth: Payer: Self-pay | Admitting: Family Medicine

## 2015-11-15 NOTE — Telephone Encounter (Signed)
Pt is having difficulty with the doxycycline (VIBRA-TABS) 100 MG tablet He is having diarrhea, anything he eats goes right through him   Please change antibiotic and call when new one is sent in   [name]

## 2015-11-15 NOTE — Telephone Encounter (Signed)
Spoke with patient and informed her per Dr.Steve Luking- We gave doxycycline for the very slight chance that this was a tick illness, high likelhood is viral, no other med covers tick illness, with major symptoms recommend stopping doxycline and treating viral i e symptomatic care. Patient verbalized understanding.

## 2015-11-15 NOTE — Telephone Encounter (Signed)
Ntsw. We gave doxy for the very slight chance that this was a tick illness, high likelhood is viral, no other med comvers tick illness, with major symptoms rec stopping dox and treating viral i e symptomatic care

## 2015-11-15 NOTE — Telephone Encounter (Signed)
His wife called back stating that he hasn't had any doxy since 9 last night. Pt is also having nausea and no appetite.

## 2015-11-21 ENCOUNTER — Ambulatory Visit (INDEPENDENT_AMBULATORY_CARE_PROVIDER_SITE_OTHER): Payer: BLUE CROSS/BLUE SHIELD | Admitting: Family Medicine

## 2015-11-21 ENCOUNTER — Ambulatory Visit: Payer: BLUE CROSS/BLUE SHIELD | Admitting: Family Medicine

## 2015-11-21 ENCOUNTER — Encounter: Payer: Self-pay | Admitting: Family Medicine

## 2015-11-21 VITALS — BP 138/86 | Temp 98.2°F | Ht 73.0 in | Wt 204.0 lb

## 2015-11-21 DIAGNOSIS — K219 Gastro-esophageal reflux disease without esophagitis: Secondary | ICD-10-CM | POA: Diagnosis not present

## 2015-11-21 DIAGNOSIS — F411 Generalized anxiety disorder: Secondary | ICD-10-CM | POA: Diagnosis not present

## 2015-11-21 DIAGNOSIS — G4700 Insomnia, unspecified: Secondary | ICD-10-CM | POA: Diagnosis not present

## 2015-11-21 MED ORDER — ALPRAZOLAM 1 MG PO TABS: ORAL_TABLET | ORAL | Status: DC

## 2015-11-21 MED ORDER — OMEPRAZOLE 20 MG PO CPDR: 20.0000 mg | DELAYED_RELEASE_CAPSULE | Freq: Every day | ORAL | Status: DC

## 2015-11-21 MED ORDER — BUPROPION HCL ER (SR) 150 MG PO TB12: 150.0000 mg | ORAL_TABLET | Freq: Two times a day (BID) | ORAL | Status: DC

## 2015-11-21 NOTE — Progress Notes (Signed)
   Subjective:    Patient ID: Samuel Henry, male    DOB: Oct 23, 1960, 55 y.o.   MRN: [id number]  HPIMed check up. Pt needs refills on xanax, wellbutrin and omeprazole. Pt states no concerns or problems.   Seen last week with fever. Pt taking doxy. Feeling much better.  Fever has calmed down significantl;y  Appetite returning abx did not cause a problem as expected   wellbutrin pt taking once daily,once per day, generally controlling symptoms  Gets phys activity with work  Review of Systems No headache, no major weight loss or weight gain, no chest pain no back pain abdominal pain no change in bowel habits complete ROS otherwise negative     Objective:   Physical Exam   Alert vitals stable HEENT normal lungs clear heart regular rate and rhythm     Assessment & Plan:  Impression chronic anxiety overall stable controlled well with meds #2 reflux stable on meds states deftly needs plan medications refilled. Further history he has not had a colonoscopy. Review blood work done free at his workplace year ago shows no PSA. I offered to screen physical here and wellness patient to think about it meds refilled WSL

## 2015-12-31 ENCOUNTER — Telehealth: Payer: Self-pay

## 2015-12-31 NOTE — Telephone Encounter (Signed)
Tried to call [name] again and Mailbox is full.

## 2015-12-31 NOTE — Telephone Encounter (Signed)
R/C staff message from Dr. Gala Romney to call pt's wife, [name] @ [phone] to triage the pt for a screening colonoscopy and EGD for reflux symptoms.  May schedule without an OV. Dr. Gala Romney will just need to review the meds first to see if he may or may not need propofol.   I tried to call at 9:10 Am and could not leave a message.  I called again at 12:21 PM and could not leave a message, but left a page.

## 2015-12-31 NOTE — Telephone Encounter (Signed)
I called Samuel Henry and Samuel Henry spoke with Threasa Beards, who said that [name] is off for the next 2 weeks.

## 2016-01-01 NOTE — Telephone Encounter (Signed)
I called again. Could not leave a VM. Left a pager number to call the office.

## 2016-01-03 NOTE — Telephone Encounter (Signed)
Sending FYI to Dr. Gala Romney.

## 2016-01-03 NOTE — Telephone Encounter (Signed)
Communication noted. Will leave it up to her to make contact with [place] when she returns.

## 2016-01-04 NOTE — Telephone Encounter (Signed)
Eyvonne Left signing him up for both an EGD with possible esophageal dilation and his first ever screening colonoscopy.  He will need Phenergan 12.5 mg IV pre-procedure. Thanks. I will get him done when I come back from vacation.

## 2016-01-04 NOTE — Telephone Encounter (Signed)
Doris, I think Dr. Gala Romney meant to route this to you.

## 2016-01-04 NOTE — Telephone Encounter (Signed)
Gastroenterology Pre-Procedure Review  Request Date: 01/03/2016 Requesting Physician: Dr. Gala Romney  PATIENT REVIEW QUESTIONS: The patient responded to the following health history questions as indicated:    Triage info given by patient's wife, [name].  PT recently treated for tick bite and has completed the Doxycycline She said pt has had Xanax on hand since the death of their daughter several years ago, but seldom takes it now. Still has the same bottle that was given to him at daughter's death [name] also feels that pt needs his esophagus stretched due to his dysphagia now She said he did well in Endo when he had his last EGD done  1. Diabetes Melitis: no 2. Joint replacements in the past 12 months: no 3. Major health problems in the past 3 months: no 4. Has an artificial valve or MVP: no 5. Has a defibrillator: no 6. Has been advised in past to take antibiotics in advance of a procedure like teeth cleaning: no 7. Family history of colon cancer: no  8. Alcohol Use: no 9. History of sleep apnea: no     MEDICATIONS & ALLERGIES:    Patient reports the following regarding taking any blood thinners:   Plavix? no Aspirin? no Coumadin? no  Patient confirms/reports the following medications:  Current Outpatient Prescriptions  Medication Sig Dispense Refill  . buPROPion (WELLBUTRIN SR) 150 MG 12 hr tablet Take 1 tablet (150 mg total) by mouth 2 (two) times daily. 60 tablet 11  . cetirizine (ZYRTEC) 10 MG tablet Take 10 mg by mouth daily.    Marland Kitchen omeprazole (PRILOSEC) 20 MG capsule Take 1 capsule (20 mg total) by mouth daily. 30 capsule 11  . ALPRAZolam (XANAX) 1 MG tablet take 1/2-1 tablet at bedtime if needed (Patient not taking: Reported on 01/03/2016) 30 tablet 2  . doxycycline (VIBRA-TABS) 100 MG tablet Take 1 tablet (100 mg total) by mouth 2 (two) times daily. (Patient not taking: Reported on 01/03/2016) 20 tablet 0  . [DISCONTINUED] Omeprazole (PRILOSEC PO) Take 20 mg by mouth daily.       No current facility-administered medications for this visit.    Patient confirms/reports the following allergies:  Allergies  Allergen Reactions  . Nsaids Other (See Comments)    REACTION: flares reflux up    No orders of the defined types were placed in this encounter.    AUTHORIZATION INFORMATION Primary Insurance:   ID #:  Group #:  Pre-Cert / Auth required: Pre-Cert / Auth #:   Secondary Insurance:   ID #:  Group #:  Pre-Cert / Auth required:  Pre-Cert / Auth #:   SCHEDULE INFORMATION: Procedure has been scheduled as follows:  Date:                Time:   Location:   This Gastroenterology Pre-Precedure Review Form is being routed to the following provider(s): R. Garfield Cornea, MD

## 2016-01-07 ENCOUNTER — Other Ambulatory Visit: Payer: Self-pay

## 2016-01-07 DIAGNOSIS — Z1211 Encounter for screening for malignant neoplasm of colon: Secondary | ICD-10-CM

## 2016-01-07 DIAGNOSIS — R1310 Dysphagia, unspecified: Secondary | ICD-10-CM

## 2016-01-07 NOTE — Telephone Encounter (Signed)
Tried to call [name] and mail box is full.  I still need pt's insurance info.

## 2016-01-07 NOTE — Telephone Encounter (Signed)
Samuel Henry Requested 01/30/2016 and pt is scheduled for 2:00 PM that day.

## 2016-01-09 NOTE — Telephone Encounter (Signed)
Letter mailed to pt to call with insurance info and which pharmacy he would like prep to be sent to.

## 2016-01-10 DIAGNOSIS — E782 Mixed hyperlipidemia: Secondary | ICD-10-CM | POA: Diagnosis not present

## 2016-01-10 DIAGNOSIS — K219 Gastro-esophageal reflux disease without esophagitis: Secondary | ICD-10-CM | POA: Diagnosis not present

## 2016-01-10 DIAGNOSIS — F329 Major depressive disorder, single episode, unspecified: Secondary | ICD-10-CM | POA: Diagnosis not present

## 2016-01-10 DIAGNOSIS — Z008 Encounter for other general examination: Secondary | ICD-10-CM | POA: Diagnosis not present

## 2016-01-10 MED ORDER — SOD PICOSULFATE-MAG OX-CIT ACD 10-3.5-12 MG-GM-GM PO PACK: 1.0000 | PACK | ORAL | Status: DC

## 2016-01-10 NOTE — Telephone Encounter (Signed)
Communication noted.  Agree with prepopik in this situation

## 2016-01-10 NOTE — Telephone Encounter (Signed)
I spoke to [place] and she said they use [facility] in [place]. She will fax me a copy of pt's insurance card when she goes back to work on Monday. She said she had discussed with Dr. Gala Romney and pt is requesting Prepopik and she said that Dr. Gala Romney said that is OK. I will send that to [facility]. She also said she had talked to Dr. Gala Romney about the instructions and she did not need any instructions mailed to them.  PT is on the schedule for 01/30/2016, the requested date, at the end of the day as she requested.

## 2016-01-10 NOTE — Telephone Encounter (Signed)
Rx has been sent to the pharmacy

## 2016-01-21 NOTE — Telephone Encounter (Signed)
Eliezer Lofts at [phone] and Mail box full, could not leave a message. Left the pager number to call [phone]. I need her to fax a copy of pt's insurance card so I can see if it requires a pre-cert.

## 2016-01-24 NOTE — Telephone Encounter (Signed)
NO PA needed for TCS

## 2016-01-28 ENCOUNTER — Telehealth: Payer: Self-pay

## 2016-01-28 NOTE — Telephone Encounter (Signed)
Communication noted.  

## 2016-01-28 NOTE — Telephone Encounter (Signed)
T/C from [place] that I needed to send in the Rx for the prep for the Prepopik. I told her that I sent it on January 10, 2016 and receipt was confirmed by the pharmacy. I told her I would call [facility] to confirm there was not a problem. I called and was told that they filled the Rx on 01/10/2016 and no one picked it up and they put back on the shelf. They said they will fill it again and the co pay is $100.00. I called [name] and [facility] for her that [facility] would have it ready and what the copay would be.

## 2016-01-30 ENCOUNTER — Ambulatory Visit (HOSPITAL_COMMUNITY)
Admission: RE | Admit: 2016-01-30 | Discharge: 2016-01-30 | Disposition: A | Discharge status: Home / Self Care | Payer: BLUE CROSS/BLUE SHIELD | Source: Ambulatory Visit | Attending: Internal Medicine | Admitting: Internal Medicine

## 2016-01-30 ENCOUNTER — Encounter (HOSPITAL_COMMUNITY)
Admission: RE | Disposition: A | Discharge status: Home / Self Care | Payer: Self-pay | Source: Ambulatory Visit | Attending: Internal Medicine

## 2016-01-30 ENCOUNTER — Encounter (HOSPITAL_COMMUNITY): Payer: Self-pay | Admitting: *Deleted

## 2016-01-30 DIAGNOSIS — R1310 Dysphagia, unspecified: Secondary | ICD-10-CM

## 2016-01-30 DIAGNOSIS — D120 Benign neoplasm of cecum: Secondary | ICD-10-CM | POA: Diagnosis not present

## 2016-01-30 DIAGNOSIS — Z79899 Other long term (current) drug therapy: Secondary | ICD-10-CM | POA: Diagnosis not present

## 2016-01-30 DIAGNOSIS — K317 Polyp of stomach and duodenum: Secondary | ICD-10-CM | POA: Insufficient documentation

## 2016-01-30 DIAGNOSIS — K229 Disease of esophagus, unspecified: Secondary | ICD-10-CM | POA: Insufficient documentation

## 2016-01-30 DIAGNOSIS — F329 Major depressive disorder, single episode, unspecified: Secondary | ICD-10-CM | POA: Diagnosis not present

## 2016-01-30 DIAGNOSIS — Z1211 Encounter for screening for malignant neoplasm of colon: Secondary | ICD-10-CM | POA: Diagnosis not present

## 2016-01-30 DIAGNOSIS — D128 Benign neoplasm of rectum: Secondary | ICD-10-CM | POA: Diagnosis not present

## 2016-01-30 DIAGNOSIS — K219 Gastro-esophageal reflux disease without esophagitis: Secondary | ICD-10-CM | POA: Diagnosis not present

## 2016-01-30 DIAGNOSIS — Z860100 Personal history of colon polyps, unspecified: Secondary | ICD-10-CM | POA: Insufficient documentation

## 2016-01-30 HISTORY — PX: ESOPHAGOGASTRODUODENOSCOPY: SHX5428

## 2016-01-30 HISTORY — PX: COLONOSCOPY: SHX5424

## 2016-01-30 HISTORY — PX: ESOPHAGEAL DILATION: SHX303

## 2016-01-30 SURGERY — COLONOSCOPY
Anesthesia: Moderate Sedation

## 2016-01-30 MED ORDER — LIDOCAINE VISCOUS 2 % MT SOLN
OROMUCOSAL | Status: DC | PRN
  Administered 2016-01-30: 3 mL via OROMUCOSAL

## 2016-01-30 MED ORDER — ONDANSETRON HCL 4 MG/2ML IJ SOLN
INTRAMUSCULAR | Status: AC
  Filled 2016-01-30: qty 2

## 2016-01-30 MED ORDER — PROMETHAZINE HCL 25 MG/ML IJ SOLN
12.5000 mg | Freq: Once | INTRAMUSCULAR | Status: AC
  Administered 2016-01-30: 12.5 mg via INTRAVENOUS

## 2016-01-30 MED ORDER — LIDOCAINE VISCOUS 2 % MT SOLN
OROMUCOSAL | Status: AC
  Filled 2016-01-30: qty 15

## 2016-01-30 MED ORDER — SODIUM CHLORIDE 0.9% FLUSH
INTRAVENOUS | Status: AC
  Filled 2016-01-30: qty 10

## 2016-01-30 MED ORDER — MEPERIDINE HCL 100 MG/ML IJ SOLN
INTRAMUSCULAR | Status: DC | PRN
  Administered 2016-01-30: 50 mg via INTRAVENOUS
  Administered 2016-01-30 (×2): 25 mg via INTRAVENOUS

## 2016-01-30 MED ORDER — PROMETHAZINE HCL 25 MG/ML IJ SOLN
INTRAMUSCULAR | Status: AC
  Filled 2016-01-30: qty 1

## 2016-01-30 MED ORDER — MEPERIDINE HCL 100 MG/ML IJ SOLN
INTRAMUSCULAR | Status: AC
  Filled 2016-01-30: qty 2

## 2016-01-30 MED ORDER — SODIUM CHLORIDE 0.9 % IV SOLN
INTRAVENOUS | Status: DC
  Administered 2016-01-30: 13:00:00 via INTRAVENOUS

## 2016-01-30 MED ORDER — STERILE WATER FOR IRRIGATION IR SOLN
Status: DC | PRN
  Administered 2016-01-30: 13:00:00

## 2016-01-30 MED ORDER — MIDAZOLAM HCL 5 MG/5ML IJ SOLN
INTRAMUSCULAR | Status: DC | PRN
  Administered 2016-01-30 (×2): 1 mg via INTRAVENOUS
  Administered 2016-01-30: 2 mg via INTRAVENOUS
  Administered 2016-01-30 (×2): 1 mg via INTRAVENOUS

## 2016-01-30 MED ORDER — ONDANSETRON HCL 4 MG/2ML IJ SOLN
INTRAMUSCULAR | Status: DC | PRN
  Administered 2016-01-30: 4 mg via INTRAVENOUS

## 2016-01-30 MED ORDER — MIDAZOLAM HCL 5 MG/5ML IJ SOLN
INTRAMUSCULAR | Status: AC
  Filled 2016-01-30: qty 10

## 2016-01-30 NOTE — Op Note (Signed)
[facility] Patient Name: Samuel Henry Procedure Date: 01/30/2016 1:07 PM MRN: [id number] Date of Birth: 04-09-1961 Attending MD: Norvel Richards , MD CSN: [id number] Age: 55 Admit Type: Outpatient Procedure:                Upper GI endoscopy with esophageal and gastric                            biopsy Indications:              Suspected esophageal reflux Providers:                Norvel Richards, MD, Otis Peak B. Gwenlyn Perking RN, RN,                            Isabella Stalling, Technician Referring MD:              Medicines:                Midazolam 4 mg IV, Meperidine 75 mg IV, Ondansetron                            4 mg IV, Promethazine [id number] mg IV Complications:            No immediate complications. Estimated Blood Loss:     Estimated blood loss: none. Procedure:                Pre-Anesthesia Assessment:                           - Prior to the procedure, a History and Physical                            was performed, and patient medications and                            allergies were reviewed. The patient's tolerance of                            previous anesthesia was also reviewed. The risks                            and benefits of the procedure and the sedation                            options and risks were discussed with the patient.                            All questions were answered, and informed consent                            was obtained. Prior Anticoagulants: The patient has                            taken no previous anticoagulant or antiplatelet  agents. ASA Grade Assessment: II - A patient with                            mild systemic disease. After reviewing the risks                            and benefits, the patient was deemed in                            satisfactory condition to undergo the procedure.                           After obtaining informed consent, the endoscope was                            passed  under direct vision. Throughout the                            procedure, the patient's blood pressure, pulse, and                            oxygen saturations were monitored continuously. The                            Endoscope was introduced through the mouth, and                            advanced to the second part of duodenum. The upper                            GI endoscopy was accomplished without difficulty.                            The patient tolerated the procedure well. Scope In: 1:21:41 PM Scope Out: 1:30:23 PM Total Procedure Duration: 0 hours 8 minutes 42 seconds  Findings:      Mucosal changes were found in the distal esophagus. 2 areas slightly       raised esophageal mucosa - the largest area being approximate 7 mm. Did       not see any obvious Barrett's epithelium. query chemical - induced.      Multiple 4 mm pedunculated and sessile polyps were found in the stomach.       This was biopsied with a cold forceps for histology. Estimated blood       loss was minimal.      The exam was otherwise without abnormality.      The duodenal bulb, first portion of the duodenum and second portion of       the duodenum were normal. This was biopsied with a cold forceps for       histology. Estimated blood loss was minimal. Impression:               - Mucosal changes in the esophagus. Biopsied. query  recent pill-induced esophagitis (doxycycline)                           - Multiple gastric polyps. Biopsied.                           - The examination was otherwise normal.                           - Normal duodenal bulb, first portion of the                            duodenum and second portion of the duodenum. Moderate Sedation:      Moderate (conscious) sedation was administered by the endoscopy nurse       and supervised by the endoscopist. The following parameters were       monitored: oxygen saturation, heart rate, blood pressure, respiratory        rate, EKG, adequacy of pulmonary ventilation, and response to care.       Total physician intraservice time was 15 minutes. Recommendation:           - Patient has a contact number available for                            emergencies. The signs and symptoms of potential                            delayed complications were discussed with the                            patient. Return to normal activities tomorrow.                            Written discharge instructions were provided to the                            patient.                           - Advance diet as tolerated.                           - Continue present medications.                           - Await pathology results.                           - No repeat upper endoscopy.                           - Return to GI office after studies are complete.                           - Continue present medications including omeprazole  20 mg daily.                           - Await pathology results. See colonoscopy report.                            Swallowing precautions reviewed with RN spouse. Procedure Code(s):        --- Professional ---                           [phone], Esophagogastroduodenoscopy, flexible,                            transoral; with biopsy, single or multiple                           99152, Moderate sedation services provided by the                            same physician or other qualified health care                            professional performing the diagnostic or                            therapeutic service that the sedation supports,                            requiring the presence of an independent trained                            observer to assist in the monitoring of the                            patient's level of consciousness and physiological                            status; initial 15 minutes of intraservice time,                             patient age 6 years or older Diagnosis Code(s):        --- Professional ---                           K22.8, Other specified diseases of esophagus                           K31.7, Polyp of stomach and duodenum CPT copyright 2016 [employer]. All rights reserved. The codes documented in this report are preliminary and upon coder review may  be revised to meet current compliance requirements. Cristopher Estimable. Taleia Sadowski, MD Norvel Richards, MD 01/30/2016 2:05:28 PM This report has been signed electronically. Number of Addenda: 0

## 2016-01-30 NOTE — H&P (Signed)
@LOGO @   Primary Care Physician:  Mickie Hillier, MD Primary Gastroenterologist:  Dr. Gala Romney  Pre-Procedure History & Physical: HPI:  Samuel Henry is a 55 y.o. male here for evaluation of long-standing reflux dependent on daily PPI therapy distant history of reflux esophagitis seen on prior EGD. Also, presents for first ever have a screening colonoscopy. No bowel symptoms. No family history of colon cancer.  Reflux symptoms have been historically fairly well controlled on omeprazole 20 mg daily. No dysphagia.  Past Medical History  Diagnosis Date  . Depression   . Acid reflux   . History of kidney stones     Past Surgical History  Procedure Laterality Date  . Knee surgery Right 2010  . Knee arthroscopy with medial menisectomy Right 03/21/2013    Procedure: KNEE ARTHROSCOPY WITH MEDIAL MENISECTOMY;  Surgeon: Carole Civil, MD;  Location: [facility];  Service: Orthopedics;  Laterality: Right;  . Chondroplasty Right 03/21/2013    Procedure: MEDIAL AND PATELLAR CHONDROPLASTY;  Surgeon: Carole Civil, MD;  Location: [facility];  Service: Orthopedics;  Laterality: Right;    Prior to Admission medications   Medication Sig Start Date End Date Taking? Authorizing Provider  ALPRAZolam Duanne Moron) 1 MG tablet take 1/2-1 tablet at bedtime if needed Patient taking differently: Take 0.5-1 mg by mouth at bedtime as needed for sleep.  11/21/15  Yes Mikey Kirschner, MD  buPROPion (WELLBUTRIN SR) 150 MG 12 hr tablet Take 1 tablet (150 mg total) by mouth 2 (two) times daily. 11/21/15  Yes Mikey Kirschner, MD  omeprazole (PRILOSEC) 20 MG capsule Take 1 capsule (20 mg total) by mouth daily. 11/21/15  Yes Mikey Kirschner, MD  Sod Picosulfate-Mag Ox-Cit Acd 10-3.5-12 MG-GM-GM PACK Take 1 Container by mouth as directed. 01/10/16  Yes Daneil Dolin, MD  doxycycline (VIBRA-TABS) 100 MG tablet Take 1 tablet (100 mg total) by mouth 2 (two) times daily. Patient not taking: Reported on 01/03/2016 11/13/15   Mikey Kirschner, MD    Allergies as of 01/07/2016 - Review Complete 11/21/2015  Allergen Reaction Noted  . Nsaids Other (See Comments) 05/28/2010    Family History  Problem Relation Age of Onset  . Hypertension Mother   . Hypertension Father   . Heart attack Father     Social History   Social History  . Marital Status: Married    Spouse Name: N/A  . Number of Children: N/A  . Years of Education: N/A   Occupational History  . Not on file.   Social History Main Topics  . Smoking status: Never Smoker   . Smokeless tobacco: Not on file  . Alcohol Use: No  . Drug Use: No  . Sexual Activity: Yes    Birth Control/ Protection: None   Other Topics Concern  . Not on file   Social History Narrative    Review of Systems: See HPI, otherwise negative ROS  Physical Exam: There were no vitals taken for this visit. General:   Alert,  Well-developed, well-nourished, pleasant and cooperative in NAD Mouth:  No deformity or lesions. Neck:  Supple; no masses or thyromegaly. No significant cervical adenopathy. Lungs:  Clear throughout to auscultation.   No wheezes, crackles, or rhonchi. No acute distress. Heart:  Regular rate and rhythm; no murmurs, clicks, rubs,  or gallops. Abdomen: Non-distended, normal bowel sounds.  Soft and nontender without appreciable mass or hepatosplenomegaly.  Pulses:  Normal pulses noted. Extremities:  Without clubbing or edema.  Impression:  Pleasant  55 year old gentleman with lifelong GERD fairly well controlled on omeprazole 20 mg daily. No alarm symptoms at this time. Prior history of rather severe reflux esophagitis on EGD. No prior screening colonoscopy. He presents worse first ever screening examination.  Recommendations:  I have offered patient both an EGD and screening colonoscopy today. The risks, benefits, limitations, imponderables and alternatives regarding both EGD and colonoscopy have been reviewed with the patient. Questions have been answered. All  parties agreeable.    Notice: This dictation was prepared with Dragon dictation along with smaller phrase technology. Any transcriptional errors that result from this process are unintentional and may not be corrected upon review.

## 2016-01-30 NOTE — Op Note (Signed)
[facility] Patient Name: Samuel Henry Procedure Date: 01/30/2016 1:34 PM MRN: [id number] Date of Birth: 1961/07/12 Attending MD: Norvel Richards , MD CSN: [id number] Age: 55 Admit Type: Outpatient Procedure:                Ileo-colonoscopy with snare polypectomy and biopsy Indications:              Screening for colorectal malignant neoplasm -                            first-ever average risk colorectal cancer screening                            examination Providers:                Norvel Richards, MD, Gwenlyn Fudge RN, RN,                            Isabella Stalling, Technician Referring MD:              Medicines:                Midazolam 6 mg IV, Meperidine [id number] mg IV Complications:            No immediate complications. Estimated Blood Loss:     Estimated blood loss was minimal. Procedure:                Pre-Anesthesia Assessment:                           - Prior to the procedure, a History and Physical                            was performed, and patient medications and                            allergies were reviewed. The patient's tolerance of                            previous anesthesia was also reviewed. The risks                            and benefits of the procedure and the sedation                            options and risks were discussed with the patient.                            All questions were answered, and informed consent                            was obtained. Prior Anticoagulants: The patient has                            taken no previous anticoagulant or antiplatelet  agents. ASA Grade Assessment: II - A patient with                            mild systemic disease. After reviewing the risks                            and benefits, the patient was deemed in                            satisfactory condition to undergo the procedure.                           After obtaining informed consent, the colonoscope                            was passed under direct vision. Throughout the                            procedure, the patient's blood pressure, pulse, and                            oxygen saturations were monitored continuously. The                            [id number] scope was introduced through                            the anus and advanced to the 5 cm into the ileum.                            The colonoscopy was performed without difficulty.                            The patient tolerated the procedure well. The                            quality of the bowel preparation was adequate. The                            terminal ileum, ileocecal valve, appendiceal                            orifice, and rectum were photographed. The                            colonoscopy was performed without difficulty. The                            entire colon was well visualized. Scope In: 1:36:01 PM Scope Out: 1:53:11 PM Scope Withdrawal Time: 0 hours 13 minutes 41 seconds  Total Procedure Duration: 0 hours 17 minutes 10 seconds  Findings:      A 10 mm polyp was found in the rectum at 10 cm from the anal verge.. The  polyp was pedunculated. The polyp was removed with a hot snare.       Resection and retrieval were complete. Estimated blood loss: none.      (2) 3 mm polyp was found in the cecum. The polyps were removed with a       cold biopsy forceps. Resection and retrieval were complete. Estimated       blood loss was minimal.      The exam was otherwise without abnormality on direct and retroflexion       views. Impression:               - One 10 mm polyp in the rectum, removed with a hot                            snare. Resected and retrieved.                           - One 5 mm polyp in the cecum, removed with a cold                            biopsy forceps. Resected and retrieved.                           - The examination was otherwise normal on direct                             and retroflexion views. Moderate Sedation:      Moderate (conscious) sedation was administered by the endoscopy nurse       and supervised by the endoscopist. The following parameters were       monitored: oxygen saturation, heart rate, blood pressure, respiratory       rate, EKG, adequacy of pulmonary ventilation, and response to care.       Total physician intraservice time was 38 minutes. Recommendation:           - Patient has a contact number available for                            emergencies. The signs and symptoms of potential                            delayed complications were discussed with the                            patient. Return to normal activities tomorrow.                            Written discharge instructions were provided to the                            patient.                           - Advance diet as tolerated.                           - Continue  present medications.                           - Repeat colonoscopy date to be determined after                            pending pathology results are reviewed for                            surveillance.                           - Return to GI clinic after studies are complete.                            See EGD rort. Procedure Code(s):        --- Professional ---                           [phone], Colonoscopy, flexible; with removal of                            tumor(s), polyp(s), or other lesion(s) by snare                            technique                           45380, 59, Colonoscopy, flexible; with biopsy,                            single or multiple                           99152, Moderate sedation services provided by the                            same physician or other qualified health care                            professional performing the diagnostic or                            therapeutic service that the sedation supports,                            requiring the presence  of an independent trained                            observer to assist in the monitoring of the                            patient's level of consciousness and physiological                            status; initial 15 minutes of intraservice time,  patient age 13 years or older                           [phone], Moderate sedation services; each additional                            15 minutes intraservice time                           [phone], Moderate sedation services; each additional                            15 minutes intraservice time Diagnosis Code(s):        --- Professional ---                           Z12.11, Encounter for screening for malignant                            neoplasm of colon                           K62.1, Rectal polyp                           D12.0, Benign neoplasm of cecum CPT copyright 2016 [employer]. All rights reserved. The codes documented in this report are preliminary and upon coder review may  be revised to meet current compliance requirements. Cristopher Estimable. Raine Elsass, MD Norvel Richards, MD 01/30/2016 2:10:51 PM This report has been signed electronically. Number of Addenda: 0

## 2016-01-30 NOTE — Discharge Instructions (Signed)
Colonoscopy Discharge Instructions  Read the instructions outlined below and refer to this sheet in the next few weeks. These discharge instructions provide you with general information on caring for yourself after you leave the hospital. Your doctor may also give you specific instructions. While your treatment has been planned according to the most current medical practices available, unavoidable complications occasionally occur. If you have any problems or questions after discharge, call Dr. Gala Romney at [phone]. ACTIVITY  You may resume your regular activity, but move at a slower pace for the next 24 hours.   Take frequent rest periods for the next 24 hours.   Walking will help get rid of the air and reduce the bloated feeling in your belly (abdomen).   No driving for 24 hours (because of the medicine (anesthesia) used during the test).    Do not sign any important legal documents or operate any machinery for 24 hours (because of the anesthesia used during the test).  NUTRITION  Drink plenty of fluids.   You may resume your normal diet as instructed by your doctor.   Begin with a light meal and progress to your normal diet. Heavy or fried foods are harder to digest and may make you feel sick to your stomach (nauseated).   Avoid alcoholic beverages for 24 hours or as instructed.  MEDICATIONS  You may resume your normal medications unless your doctor tells you otherwise.  WHAT YOU CAN EXPECT TODAY  Some feelings of bloating in the abdomen.   Passage of more gas than usual.   Spotting of blood in your stool or on the toilet paper.  IF YOU HAD POLYPS REMOVED DURING THE COLONOSCOPY:  No aspirin products for 7 days or as instructed.   No alcohol for 7 days or as instructed.   Eat a soft diet for the next 24 hours.  FINDING OUT THE RESULTS OF YOUR TEST Not all test results are available during your visit. If your test results are not back during the visit, make an appointment  with your caregiver to find out the results. Do not assume everything is normal if you have not heard from your caregiver or the medical facility. It is important for you to follow up on all of your test results.  SEEK IMMEDIATE MEDICAL ATTENTION IF:  You have more than a spotting of blood in your stool.   Your belly is swollen (abdominal distention).   You are nauseated or vomiting.   You have a temperature over 101.  You have abdominal pain or discomfort that is severe or gets worse throughout the day. EGD Discharge instructions Please read the instructions outlined below and refer to this sheet in the next few weeks. These discharge instructions provide you with general information on caring for yourself after you leave the hospital. Your doctor may also give you specific instructions. While your treatment has been planned according to the most current medical practices available, unavoidable complications occasionally occur. If you have any problems or questions after discharge, please call your doctor. ACTIVITY You may resume your regular activity but move at a slower pace for the next 24 hours.  Take frequent rest periods for the next 24 hours.  Walking will help expel (get rid of) the air and reduce the bloated feeling in your abdomen.  No driving for 24 hours (because of the anesthesia (medicine) used during the test).  You may shower.  Do not sign any important legal documents or operate any machinery for 24  hours (because of the anesthesia used during the test).  NUTRITION Drink plenty of fluids.  You may resume your normal diet.  Begin with a light meal and progress to your normal diet.  Avoid alcoholic beverages for 24 hours or as instructed by your caregiver.  MEDICATIONS You may resume your normal medications unless your caregiver tells you otherwise.  WHAT YOU CAN EXPECT TODAY You may experience abdominal discomfort such as a feeling of fullness or gas pains.   FOLLOW-UP Your doctor will discuss the results of your test with you.  SEEK IMMEDIATE MEDICAL ATTENTION IF ANY OF THE FOLLOWING OCCUR: Excessive nausea (feeling sick to your stomach) and/or vomiting.  Severe abdominal pain and distention (swelling).  Trouble swallowing.  Temperature over 101 F (37.8 C).  Rectal bleeding or vomiting of blood.    GERD and colon polyp information provided  Continue omeprazole 20 mg daily  Further recommendations to follow pending review of pathology report  Gastroesophageal Reflux Disease, Adult Normally, food travels down the esophagus and stays in the stomach to be digested. If a person has gastroesophageal reflux disease (GERD), food and stomach acid move back up into the esophagus. When this happens, the esophagus becomes sore and swollen (inflamed). Over time, GERD can make small holes (ulcers) in the lining of the esophagus. HOME CARE Diet  Follow a diet as told by your doctor. You may need to avoid foods and drinks such as:  Coffee and tea (with or without caffeine).  Drinks that contain alcohol.  Energy drinks and sports drinks.  Carbonated drinks or sodas.  Chocolate and cocoa.  Peppermint and mint flavorings.  Garlic and onions.  Horseradish.  Spicy and acidic foods, such as peppers, chili powder, curry powder, vinegar, hot sauces, and BBQ sauce.  Citrus fruit juices and citrus fruits, such as oranges, lemons, and limes.  Tomato-based foods, such as red sauce, chili, salsa, and pizza with red sauce.  Fried and fatty foods, such as donuts, french fries, potato chips, and high-fat dressings.  High-fat meats, such as hot dogs, rib eye steak, sausage, ham, and bacon.  High-fat dairy items, such as whole milk, butter, and cream cheese.  Eat small meals often. Avoid eating large meals.  Avoid drinking large amounts of liquid with your meals.  Avoid eating meals during the 2-3 hours before bedtime.  Avoid lying down right  after you eat.  Do not exercise right after you eat. General Instructions  Pay attention to any changes in your symptoms.  Take over-the-counter and prescription medicines only as told by your doctor. Do not take aspirin, ibuprofen, or other NSAIDs unless your doctor says it is okay.  Do not use any tobacco products, including cigarettes, chewing tobacco, and e-cigarettes. If you need help quitting, ask your doctor.  Wear loose clothes. Do not wear anything tight around your waist.  Raise (elevate) the head of your bed about 6 inches (15 cm).  Try to lower your stress. If you need help doing this, ask your doctor.  If you are overweight, lose an amount of weight that is healthy for you. Ask your doctor about a safe weight loss goal.  Keep all follow-up visits as told by your doctor. This is important. GET HELP IF:  You have new symptoms.  You lose weight and you do not know why it is happening.  You have trouble swallowing, or it hurts to swallow.  You have wheezing or a cough that keeps happening.  Your symptoms do not  get better with treatment.  You have a hoarse voice. GET HELP RIGHT AWAY IF:  You have pain in your arms, neck, jaw, teeth, or back.  You feel sweaty, dizzy, or light-headed.  You have chest pain or shortness of breath.  You throw up (vomit) and your throw up looks like blood or coffee grounds.  You pass out (faint).  Your poop (stool) is bloody or black.  You cannot swallow, drink, or eat.   This information is not intended to replace advice given to you by your health care provider. Make sure you discuss any questions you have with your health care provider.   Document Released: 12/17/2007 Document Revised: 03/21/2015 Document Reviewed: 10/25/2014 Elsevier Interactive Patient Education 2016 Elsevier Inc.  Colon Polyps Polyps are lumps of extra tissue growing inside the body. Polyps can grow in the large intestine (colon). Most colon polyps  are noncancerous (benign). However, some colon polyps can become cancerous over time. Polyps that are larger than a pea may be harmful. To be safe, caregivers remove and test all polyps. CAUSES  Polyps form when mutations in the genes cause your cells to grow and divide even though no more tissue is needed. RISK FACTORS There are a number of risk factors that can increase your chances of getting colon polyps. They include:  Being older than 50 years.  Family history of colon polyps or colon cancer.  Long-term colon diseases, such as colitis or Crohn disease.  Being overweight.  Smoking.  Being inactive.  Drinking too much alcohol. SYMPTOMS  Most small polyps do not cause symptoms. If symptoms are present, they may include:  Blood in the stool. The stool may look dark red or black.  Constipation or diarrhea that lasts longer than 1 week. DIAGNOSIS People often do not know they have polyps until their caregiver finds them during a regular checkup. Your caregiver can use 4 tests to check for polyps:  Digital rectal exam. The caregiver wears gloves and feels inside the rectum. This test would find polyps only in the rectum.  Barium enema. The caregiver puts a liquid called barium into your rectum before taking X-rays of your colon. Barium makes your colon look white. Polyps are dark, so they are easy to see in the X-ray pictures.  Sigmoidoscopy. A thin, flexible tube (sigmoidoscope) is placed into your rectum. The sigmoidoscope has a light and tiny camera in it. The caregiver uses the sigmoidoscope to look at the last third of your colon.  Colonoscopy. This test is like sigmoidoscopy, but the caregiver looks at the entire colon. This is the most common method for finding and removing polyps. TREATMENT  Any polyps will be removed during a sigmoidoscopy or colonoscopy. The polyps are then tested for cancer. PREVENTION  To help lower your risk of getting more colon polyps:  Eat  plenty of fruits and vegetables. Avoid eating fatty foods.  Do not smoke.  Avoid drinking alcohol.  Exercise every day.  Lose weight if recommended by your caregiver.  Eat plenty of calcium and folate. Foods that are rich in calcium include milk, cheese, and broccoli. Foods that are rich in folate include chickpeas, kidney beans, and spinach. HOME CARE INSTRUCTIONS Keep all follow-up appointments as directed by your caregiver. You may need periodic exams to check for polyps. SEEK MEDICAL CARE IF: You notice bleeding during a bowel movement.   This information is not intended to replace advice given to you by your health care provider. Make sure you discuss  any questions you have with your health care provider.   Document Released: 03/26/2004 Document Revised: 07/21/2014 Document Reviewed: 09/09/2011 Elsevier Interactive Patient Education [employer].

## 2016-02-01 ENCOUNTER — Encounter: Payer: Self-pay | Admitting: Internal Medicine

## 2016-02-04 ENCOUNTER — Encounter (HOSPITAL_COMMUNITY): Payer: Self-pay | Admitting: Internal Medicine

## 2016-04-02 DIAGNOSIS — Z23 Encounter for immunization: Secondary | ICD-10-CM | POA: Diagnosis not present

## 2016-04-10 DIAGNOSIS — E782 Mixed hyperlipidemia: Secondary | ICD-10-CM | POA: Diagnosis not present

## 2016-04-10 DIAGNOSIS — Z008 Encounter for other general examination: Secondary | ICD-10-CM | POA: Diagnosis not present

## 2016-04-10 DIAGNOSIS — K219 Gastro-esophageal reflux disease without esophagitis: Secondary | ICD-10-CM | POA: Diagnosis not present

## 2016-04-10 DIAGNOSIS — F329 Major depressive disorder, single episode, unspecified: Secondary | ICD-10-CM | POA: Diagnosis not present

## 2016-04-30 ENCOUNTER — Other Ambulatory Visit: Payer: Self-pay | Admitting: *Deleted

## 2016-04-30 MED ORDER — OMEPRAZOLE 20 MG PO CPDR: 20.0000 mg | DELAYED_RELEASE_CAPSULE | Freq: Every day | ORAL | 0 refills | Status: DC

## 2016-04-30 MED ORDER — BUPROPION HCL ER (SR) 150 MG PO TB12: 150.0000 mg | ORAL_TABLET | Freq: Two times a day (BID) | ORAL | 0 refills | Status: DC

## 2016-04-30 MED ORDER — ALPRAZOLAM 1 MG PO TABS: ORAL_TABLET | ORAL | 0 refills | Status: DC

## 2016-04-30 NOTE — Progress Notes (Signed)
One mo worth all six mo ck up due mid next mo

## 2016-05-09 ENCOUNTER — Other Ambulatory Visit: Payer: Self-pay | Admitting: Family Medicine

## 2016-06-28 ENCOUNTER — Other Ambulatory Visit: Payer: Self-pay | Admitting: Family Medicine

## 2016-09-02 ENCOUNTER — Other Ambulatory Visit: Payer: Self-pay | Admitting: *Deleted

## 2016-09-02 ENCOUNTER — Other Ambulatory Visit: Payer: Self-pay | Admitting: Family Medicine

## 2016-09-02 MED ORDER — ALPRAZOLAM 1 MG PO TABS: ORAL_TABLET | ORAL | 2 refills | Status: DC

## 2016-09-02 NOTE — Telephone Encounter (Signed)
Ok plus two ref 

## 2016-09-02 NOTE — Telephone Encounter (Signed)
Ok times one mo, plus two rf, let pt know at the latest ntbs before finish of this

## 2016-11-20 DIAGNOSIS — N200 Calculus of kidney: Secondary | ICD-10-CM | POA: Diagnosis not present

## 2016-11-20 DIAGNOSIS — N5201 Erectile dysfunction due to arterial insufficiency: Secondary | ICD-10-CM | POA: Diagnosis not present

## 2016-12-04 ENCOUNTER — Ambulatory Visit (INDEPENDENT_AMBULATORY_CARE_PROVIDER_SITE_OTHER): Payer: BLUE CROSS/BLUE SHIELD | Admitting: Family Medicine

## 2016-12-04 ENCOUNTER — Encounter: Payer: Self-pay | Admitting: Family Medicine

## 2016-12-04 VITALS — BP 120/84 | Ht 73.0 in | Wt 202.0 lb

## 2016-12-04 DIAGNOSIS — F411 Generalized anxiety disorder: Secondary | ICD-10-CM | POA: Diagnosis not present

## 2016-12-04 MED ORDER — ALPRAZOLAM 1 MG PO TABS: ORAL_TABLET | ORAL | 5 refills | Status: DC

## 2016-12-04 MED ORDER — BUPROPION HCL ER (SR) 150 MG PO TB12: 150.0000 mg | ORAL_TABLET | Freq: Two times a day (BID) | ORAL | 11 refills | Status: DC

## 2016-12-04 NOTE — Progress Notes (Signed)
   Subjective:    Patient ID: Samuel Henry, male    DOB: [birth year], 56 y.o.   MRN: [id number]  Anxiety  Presents for follow-up visit.     Working at [employer]  Exercise not so hot, golfing once per wk  Patient notes ongoing compliance with antidepressant medication. No obvious side effects. Reports does not miss a dose. Overall continues to help depression substantially. No thoughts of homicide or suicide. Would like to maintain medication.   Uses wellbutrin once per day  Anxiety level has incr lately  Pt noted discolration on face, worse over the past six mo, had bump then bled a little    Patient also has concerns of discoloration to right side of face.  Review of Systems No headache, no major weight loss or weight gain, no chest pain no back pain abdominal pain no change in bowel habits complete ROS otherwise negative     Objective:   Physical Exam  Alert vitals stable, NAD. Blood pressure good on repeat. HEENT normal. Lungs clear. Heart regular rate and rhythm.       Assessment & Plan:  Impression chronic anxiety. Some worsening lately. Patient has been taking Wellbutrin just once per day but will increase to twice per day to see if this helps. Exercise discussed. Wellness offered. Has been declined in past. WSL

## 2016-12-19 ENCOUNTER — Other Ambulatory Visit: Payer: Self-pay | Admitting: Family Medicine

## 2017-02-01 ENCOUNTER — Other Ambulatory Visit: Payer: Self-pay | Admitting: Family Medicine

## 2017-04-20 ENCOUNTER — Other Ambulatory Visit: Payer: Self-pay | Admitting: Family Medicine

## 2017-04-30 DIAGNOSIS — Z008 Encounter for other general examination: Secondary | ICD-10-CM | POA: Diagnosis not present

## 2017-04-30 DIAGNOSIS — Z719 Counseling, unspecified: Secondary | ICD-10-CM | POA: Diagnosis not present

## 2017-04-30 DIAGNOSIS — F329 Major depressive disorder, single episode, unspecified: Secondary | ICD-10-CM | POA: Diagnosis not present

## 2017-04-30 DIAGNOSIS — K219 Gastro-esophageal reflux disease without esophagitis: Secondary | ICD-10-CM | POA: Diagnosis not present

## 2017-04-30 DIAGNOSIS — E782 Mixed hyperlipidemia: Secondary | ICD-10-CM | POA: Diagnosis not present

## 2017-05-20 ENCOUNTER — Ambulatory Visit: Payer: BLUE CROSS/BLUE SHIELD | Admitting: Family Medicine

## 2017-06-09 ENCOUNTER — Other Ambulatory Visit: Payer: Self-pay | Admitting: *Deleted

## 2017-06-09 MED ORDER — ALPRAZOLAM 1 MG PO TABS: ORAL_TABLET | ORAL | 5 refills | Status: DC

## 2017-06-09 NOTE — Telephone Encounter (Signed)
Six mo ok 

## 2017-10-21 ENCOUNTER — Other Ambulatory Visit: Payer: Self-pay | Admitting: Family Medicine

## 2017-11-19 DIAGNOSIS — E782 Mixed hyperlipidemia: Secondary | ICD-10-CM | POA: Diagnosis not present

## 2017-11-19 DIAGNOSIS — Z719 Counseling, unspecified: Secondary | ICD-10-CM | POA: Diagnosis not present

## 2017-11-19 DIAGNOSIS — F329 Major depressive disorder, single episode, unspecified: Secondary | ICD-10-CM | POA: Diagnosis not present

## 2017-11-19 DIAGNOSIS — Z008 Encounter for other general examination: Secondary | ICD-10-CM | POA: Diagnosis not present

## 2017-12-23 ENCOUNTER — Ambulatory Visit: Payer: BLUE CROSS/BLUE SHIELD | Admitting: Family Medicine

## 2017-12-23 ENCOUNTER — Encounter: Payer: Self-pay | Admitting: Family Medicine

## 2017-12-23 VITALS — BP 130/80 | HR 65 | Temp 97.7°F | Ht 71.0 in | Wt 204.8 lb

## 2017-12-23 DIAGNOSIS — J329 Chronic sinusitis, unspecified: Secondary | ICD-10-CM | POA: Diagnosis not present

## 2017-12-23 DIAGNOSIS — J310 Chronic rhinitis: Secondary | ICD-10-CM

## 2017-12-23 MED ORDER — DICLOFENAC SODIUM 75 MG PO TBEC: DELAYED_RELEASE_TABLET | ORAL | 0 refills | Status: DC

## 2017-12-23 MED ORDER — CEFDINIR 300 MG PO CAPS: 300.0000 mg | ORAL_CAPSULE | Freq: Two times a day (BID) | ORAL | 0 refills | Status: DC

## 2017-12-23 NOTE — Progress Notes (Signed)
   Subjective:    Patient ID: Samuel Henry, male    DOB: 11-16-1960, 57 y.o.   MRN: [id number]  Sinusitis  This is a new problem. The current episode started in the past 7 days. The problem is unchanged. Maximum temperature: 99.7 highest  Associated symptoms include congestion and headaches. Past treatments include acetaminophen and oral decongestants. The treatment provided mild relief.   Sickness started last wed, got sick , sore throat then congestion  Frontal headache hit, back behind the eyes  Tylenol struck did not do much  Took ibuprofens and aspirin   Started running low gr fever and feeling poorly  Facial tend and pain  Along with dim energy   Pos tend and pain   Tyle di dnot help    Notes incr bleeding with aspirin   Review of Systems  HENT: Positive for congestion.   Neurological: Positive for headaches.       Objective:   Physical Exam Alert, mild malaise. Hydration good Vitals stable. frontal/ maxillary tenderness evident positive nasal congestion. pharynx normal neck supple  lungs clear/no crackles or wheezes. heart regular in rhythm        Assessment & Plan:  Impression rhinosinusitis likely post viral, discussed with patient. plan antibiotics prescribed. Questions answered. Symptomatic care discussed. warning signs discussed. WSL

## 2018-01-25 DIAGNOSIS — M205X2 Other deformities of toe(s) (acquired), left foot: Secondary | ICD-10-CM | POA: Diagnosis not present

## 2018-01-25 DIAGNOSIS — M79675 Pain in left toe(s): Secondary | ICD-10-CM | POA: Diagnosis not present

## 2018-01-25 DIAGNOSIS — M19079 Primary osteoarthritis, unspecified ankle and foot: Secondary | ICD-10-CM | POA: Diagnosis not present

## 2018-03-04 ENCOUNTER — Telehealth: Payer: Self-pay

## 2018-03-04 ENCOUNTER — Other Ambulatory Visit: Payer: Self-pay | Admitting: Family Medicine

## 2018-03-04 DIAGNOSIS — E782 Mixed hyperlipidemia: Secondary | ICD-10-CM | POA: Diagnosis not present

## 2018-03-04 DIAGNOSIS — F329 Major depressive disorder, single episode, unspecified: Secondary | ICD-10-CM | POA: Diagnosis not present

## 2018-03-04 DIAGNOSIS — Z008 Encounter for other general examination: Secondary | ICD-10-CM | POA: Diagnosis not present

## 2018-03-04 DIAGNOSIS — Z719 Counseling, unspecified: Secondary | ICD-10-CM | POA: Diagnosis not present

## 2018-03-04 MED ORDER — ALPRAZOLAM 1 MG PO TABS: ORAL_TABLET | ORAL | 0 refills | Status: DC

## 2018-03-04 NOTE — Telephone Encounter (Signed)
Sent rx to [facility]

## 2018-03-04 NOTE — Telephone Encounter (Signed)
Ok plus one ref needs f u visit before furter

## 2018-03-04 NOTE — Telephone Encounter (Signed)
Per [facility] the pt is requesting a refill on Alprazolam 1 mg take 1/2-1 tablet po QHS Prn. Please advise.

## 2018-03-23 ENCOUNTER — Ambulatory Visit: Payer: BLUE CROSS/BLUE SHIELD | Admitting: Family Medicine

## 2018-03-23 ENCOUNTER — Encounter: Payer: Self-pay | Admitting: Family Medicine

## 2018-03-23 VITALS — BP 116/78 | Ht 71.0 in | Wt 199.8 lb

## 2018-03-23 DIAGNOSIS — F5101 Primary insomnia: Secondary | ICD-10-CM

## 2018-03-23 DIAGNOSIS — K2100 Gastro-esophageal reflux disease with esophagitis, without bleeding: Secondary | ICD-10-CM

## 2018-03-23 DIAGNOSIS — F411 Generalized anxiety disorder: Secondary | ICD-10-CM | POA: Diagnosis not present

## 2018-03-23 MED ORDER — SUCRALFATE 1 G PO TABS: 1.0000 g | ORAL_TABLET | Freq: Three times a day (TID) | ORAL | 0 refills | Status: DC

## 2018-03-23 MED ORDER — CEFDINIR 300 MG PO CAPS: 300.0000 mg | ORAL_CAPSULE | Freq: Two times a day (BID) | ORAL | 0 refills | Status: DC

## 2018-03-23 NOTE — Progress Notes (Signed)
   Subjective:    Patient ID: Samuel Henry, male    DOB: May 08, 1961, 57 y.o.   MRN: [id number]  HPI  Patient arrives with no appetite and off and on nausea for 2 weeks. Patient stated he has vomiting about 2 weeks ago and since then he has had no appetite and off and on nausea. Patient also feels his taste and smell is off  tw weeks ago did not feel well, started vomiting. Lasted about twele hrs  Took phenergan and the nausea stopped  taste and smell has not been quit right  This weekend had some headaches and off and on  Has had tate in the mouth eve since couple seeks ago    Had hx of hospitalization for gastritis and then on to   The ER and then they found gastritiz  Ended up getting dilation with inflammed   Ha s had f u of coon and upper endoscopy since  Patient notes chronic depression and anxiety overall stable.  Compliant with medications.  No suicidal thoughts.  At times gets anxious.  At times gets to feeling down.  Generally medicine helps well.  Patient compliant with insomnia medication. Generally takes most nights. No obvious morning drowsiness. Definitely helps patient sleep. Without it patient states would not get a good nights rest.    Review of Systems No headache, no major weight loss or weight gain, no chest pain no back pain abdominal pain no change in bowel habits complete ROS otherwise negative     Objective:   Physical Exam  Alert and oriented, vitals reviewed and stable, NAD ENT-TM's and ext canals WNL bilat via otoscopic exam Soft palate, tonsils and post pharynx WNL via oropharyngeal exam Neck-symmetric, no masses; thyroid nonpalpable and nontender Pulmonary-no tachypnea or accessory muscle use; Clear without wheezes via auscultation Card--no abnrml murmurs, rhythm reg and rate WNL Carotid pulses symmetric, without bruits Positive mild epigastric tenderness to palpation.  No rebound no guarding excellent bowel sounds no masses      Assessment  & Plan:  Impression 1 insomnia.  Overall good control discussed maintain same meds  2.  Depression/anxiety.  Chronic in nature.  Ongoing need for medicine patient states he was helping.  Patient states he handles medication well  3.  Chronic reflux.  Now element of gastritis.  Substantial worsening lately.  Will add a short-term trial of Carafate.  Hopefully this will resolve the issue if not notify.  Warning signs discussed  Greater than 50% of this 25 minute face to face visit was spent in counseling and discussion and coordination of care regarding the above diagnosis/diagnosies

## 2018-05-12 ENCOUNTER — Other Ambulatory Visit: Payer: Self-pay | Admitting: Family Medicine

## 2018-05-12 NOTE — Telephone Encounter (Signed)
Ok plus three monthlky ref

## 2018-05-25 ENCOUNTER — Telehealth: Payer: Self-pay | Admitting: Family Medicine

## 2018-05-25 NOTE — Telephone Encounter (Signed)
Pt requesting refill on cefdinir (OMNICEF) 300 MG capsule. He is stating he is having the same symptoms as back in Sept when he came in to be seen for sinus infection. He is having pressure behind his eyes, headache little sinus congestion. All started Sunday. If able to call in please send to [facility], [place]

## 2018-05-27 ENCOUNTER — Encounter: Payer: Self-pay | Admitting: Family Medicine

## 2018-05-27 ENCOUNTER — Ambulatory Visit: Payer: BLUE CROSS/BLUE SHIELD | Admitting: Family Medicine

## 2018-05-27 VITALS — BP 122/80 | Temp 98.3°F | Ht 71.0 in | Wt 201.0 lb

## 2018-05-27 DIAGNOSIS — J310 Chronic rhinitis: Secondary | ICD-10-CM

## 2018-05-27 DIAGNOSIS — J329 Chronic sinusitis, unspecified: Secondary | ICD-10-CM

## 2018-05-27 MED ORDER — CEFDINIR 300 MG PO CAPS: 300.0000 mg | ORAL_CAPSULE | Freq: Two times a day (BID) | ORAL | 0 refills | Status: DC

## 2018-05-27 NOTE — Telephone Encounter (Signed)
Left message to return call 

## 2018-05-27 NOTE — Telephone Encounter (Signed)
Sorry too much distance between

## 2018-05-27 NOTE — Progress Notes (Signed)
   Subjective:    Patient ID: Samuel Henry, male    DOB: 06/24/1961, 57 y.o.   MRN: [id number]  Sinusitis  This is a new problem. Episode onset: 4 days. Associated symptoms include congestion and headaches. Treatments tried: steam, sinus meds.   Dull heaache behind the left ey  With some nausea  mon head was sore and uncfortable  tue morn got to hurit  Did  [place] and it helped  yest was ok took some tylenol  Still feels presre and discomfort   No majr cog or cough  This weekend did get some nasal irritation   Used cool mist humid during th week    col air often helps   Runny nose some but not a ot, pos secretions in th morn ''  Pt has hx of headaches in the past  accompnied by nausea,    possible hx of migranies in the past,  Review of Systems  HENT: Positive for congestion.   Neurological: Positive for headaches.       Objective:   Physical Exam  Alert, mild malaise. Hydration good Vitals stable. frontal/ maxillary tenderness evident positive nasal congestion. pharynx normal neck supple  lungs clear/no crackles or wheezes. heart regular in rhythm       Assessment & Plan:  Impression rhinosinusitis likely/the potential for this being a uncommon migraine variant was also discussed.  Last time the antibiotics worked so we will go ahead and prescribe., discussed with patient. plan antibiotics prescribed. Questions answered. Symptomatic care discussed. warning signs discussed. WSL

## 2018-05-27 NOTE — Telephone Encounter (Signed)
Patient notified and scheduled office visit.

## 2018-07-27 ENCOUNTER — Other Ambulatory Visit: Payer: Self-pay | Admitting: Family Medicine

## 2018-08-20 ENCOUNTER — Telehealth: Payer: Self-pay | Admitting: Family Medicine

## 2018-08-20 ENCOUNTER — Other Ambulatory Visit: Payer: Self-pay | Admitting: *Deleted

## 2018-08-20 MED ORDER — OSELTAMIVIR PHOSPHATE 75 MG PO CAPS: 75.0000 mg | ORAL_CAPSULE | Freq: Every day | ORAL | 0 refills | Status: DC

## 2018-08-20 NOTE — Telephone Encounter (Signed)
tamiflu 75 ten one qd fofr ten d

## 2018-08-20 NOTE — Telephone Encounter (Signed)
Med sent to pharm. Pt notified.  

## 2018-08-20 NOTE — Telephone Encounter (Signed)
Pt requesting medication of preventative tamiflu. Advise, pt exposed to flu by grandchild.    Pharmacy:  [facility], [place]

## 2018-10-02 ENCOUNTER — Other Ambulatory Visit: Payer: Self-pay | Admitting: Family Medicine

## 2018-11-08 ENCOUNTER — Other Ambulatory Visit: Payer: Self-pay

## 2018-11-08 ENCOUNTER — Ambulatory Visit (INDEPENDENT_AMBULATORY_CARE_PROVIDER_SITE_OTHER): Payer: BLUE CROSS/BLUE SHIELD | Admitting: Family Medicine

## 2018-11-08 DIAGNOSIS — J329 Chronic sinusitis, unspecified: Secondary | ICD-10-CM

## 2018-11-08 MED ORDER — CEFDINIR 300 MG PO CAPS: 300.0000 mg | ORAL_CAPSULE | Freq: Two times a day (BID) | ORAL | 0 refills | Status: DC

## 2018-11-08 NOTE — Progress Notes (Signed)
   Subjective:    Patient ID: Samuel Henry, male    DOB: 08-14-1960, 58 y.o.   MRN: [id number] Format - video plus audio  Patient present at home Provider present at office Consent for interaction obtained Coronavirus outbreak made virtual visit necessary  Sinusitis  This is a new problem. Episode onset: 3 days. There has been no fever. Associated symptoms include congestion and coughing. (Scratchy throat, yellow drainage from eye) Treatments tried: allergy drops for eye.   Cannot take eye drops with sulfa because in the past it made eye worse.   Virtual Visit via Video Note  I connected with Samuel Henry on 11/08/18 at  2:30 PM EDT by a video enabled telemedicine application and verified that I am speaking with the correct person using two identifiers.   I discussed the limitations of evaluation and management by telemedicine and the availability of in person appointments. The patient expressed understanding and agreed to proceed.  History of Present Illness:    Observations/Objective:   Assessment and Plan:   Follow Up Instructions:    I discussed the assessment and treatment plan with the patient. The patient was provided an opportunity to ask questions and all were answered. The patient agreed with the plan and demonstrated an understanding of the instructions.   The patient was advised to call back or seek an in-person evaluation if the symptoms worsen or if the condition fails to improve as anticipated.  I provided 15 minutes of non-face-to-face time during this encounter.  Patient arrives with several days of symptoms.  Frontal congestion.  Some element of headache.  Nasal discharge.  Sore throat.  Left eye irritation and crustiness.  Wife has similar illness  No shortness of breath.  No cough.  No fever   Review of Systems  HENT: Positive for congestion.   Respiratory: Positive for cough.        Objective:   Physical Exam   Virtual visit     Assessment &  Plan:  Impression probable post URI rhinosinusitis versus allergy initiated rhinosinusitis.  Symptom care discussed.  Warning signs discussed.  Doubt coronavirus though potential of early involvement discussed.  Garamycin drops left eye symptom care discussed

## 2018-11-09 ENCOUNTER — Telehealth: Payer: Self-pay | Admitting: Family Medicine

## 2018-11-09 MED ORDER — GENTAMICIN SULFATE 0.3 % OP SOLN: 2.0000 [drp] | Freq: Four times a day (QID) | OPHTHALMIC | 0 refills | Status: DC

## 2018-11-09 NOTE — Telephone Encounter (Signed)
Pt wife did call back and was informed that medication was at pharmacy. Pt wife verbalized understanding

## 2018-11-09 NOTE — Telephone Encounter (Signed)
Pt's wife called to say that the pharmacy did not receive the prescription for any eye drops that Dr. Richardson Landry was supposed to order yesterday after virtual visit  Please advise & call pt when done    [facility]

## 2018-11-09 NOTE — Telephone Encounter (Signed)
Contacted [facility] and they verified that they have received the eye drops. Eye drops have been received.  Left message to return call on wife's phone

## 2019-02-13 ENCOUNTER — Other Ambulatory Visit: Payer: Self-pay | Admitting: Family Medicine

## 2019-02-16 ENCOUNTER — Encounter: Payer: Self-pay | Admitting: Internal Medicine

## 2019-03-05 ENCOUNTER — Other Ambulatory Visit: Payer: Self-pay | Admitting: Family Medicine

## 2019-03-07 NOTE — Telephone Encounter (Signed)
One mo worth, last m h visit nearly one yr ago, needs o v call pt

## 2019-03-08 NOTE — Telephone Encounter (Signed)
lvm for pt to call back and schedule virtual visit

## 2019-03-10 NOTE — Telephone Encounter (Signed)
Left message to schedule medication follow up. 

## 2019-03-15 NOTE — Telephone Encounter (Signed)
LVM to schedule appointment again.

## 2019-03-22 NOTE — Telephone Encounter (Signed)
Patient scheduled appointment for 9/11 for medication foloowup

## 2019-03-25 ENCOUNTER — Ambulatory Visit (INDEPENDENT_AMBULATORY_CARE_PROVIDER_SITE_OTHER): Payer: BC Managed Care – PPO | Admitting: Family Medicine

## 2019-03-25 ENCOUNTER — Encounter: Payer: Self-pay | Admitting: Family Medicine

## 2019-03-25 ENCOUNTER — Other Ambulatory Visit: Payer: Self-pay

## 2019-03-25 DIAGNOSIS — F411 Generalized anxiety disorder: Secondary | ICD-10-CM

## 2019-03-25 MED ORDER — ALPRAZOLAM 1 MG PO TABS: ORAL_TABLET | ORAL | 2 refills | Status: DC

## 2019-03-25 MED ORDER — BUPROPION HCL ER (SR) 150 MG PO TB12: 150.0000 mg | ORAL_TABLET | Freq: Two times a day (BID) | ORAL | 5 refills | Status: DC

## 2019-03-25 NOTE — Progress Notes (Signed)
   Subjective:  Audio plus video  Patient ID: Samuel Henry, male    DOB: [birth year], 58 y.o.   MRN: [id number]  HPI Pt is here for follow up. Pt states he is doing well. No issues, no concerns at this time. Pt states he has cut back his Wellbutrin to once a day.  Virtual Visit via Video Note  I connected with Samuel Henry on 03/25/19 at  8:30 AM EDT by a video enabled telemedicine application and verified that I am speaking with the correct person using two identifiers.  Location: Patient: home Provider: office   I discussed the limitations of evaluation and management by telemedicine and the availability of in person appointments. The patient expressed understanding and agreed to proceed.  History of Present Illness:    Observations/Objective:   Assessment and Plan:   Follow Up Instructions:    I discussed the assessment and treatment plan with the patient. The patient was provided an opportunity to ask questions and all were answered. The patient agreed with the plan and demonstrated an understanding of the instructions.   The patient was advised to call back or seek an in-person evaluation if the symptoms worsen or if the condition fails to improve as anticipated.  I provided 20 minutes of non-face-to-face time during this encounter. Patient exercising fairly regularly.  Sometimes forgets to take a second Wellbutrin dose of the day.  Notes overall it has helped his anxiety.  Uses Xanax only on occasion.  Trying to watch his diet these days.  Has not returned to work yet due to [place], L[employer]    Review of Systems No headache, no major weight loss or weight gain, no chest pain no back pain abdominal pain no change in bowel habits complete ROS otherwise negative     Objective:   Physical Exam   Virtual     Assessment & Plan:  Impression generalized anxiety disorder.  Exercise strongly encouraged diet discussed.  Medications refilled.   Compliance discussed and encouraged.  Follow-up in 6 months offered wellness physicals anytime he is ready to start doing them

## 2019-04-12 DIAGNOSIS — E782 Mixed hyperlipidemia: Secondary | ICD-10-CM | POA: Diagnosis not present

## 2019-04-12 DIAGNOSIS — Z139 Encounter for screening, unspecified: Secondary | ICD-10-CM | POA: Diagnosis not present

## 2019-04-12 DIAGNOSIS — Z0130 Encounter for examination of blood pressure without abnormal findings: Secondary | ICD-10-CM | POA: Diagnosis not present

## 2019-04-19 DIAGNOSIS — E782 Mixed hyperlipidemia: Secondary | ICD-10-CM | POA: Diagnosis not present

## 2019-04-26 ENCOUNTER — Other Ambulatory Visit: Payer: Self-pay | Admitting: *Deleted

## 2019-04-27 NOTE — Telephone Encounter (Signed)
Six no worth both

## 2019-04-28 ENCOUNTER — Other Ambulatory Visit: Payer: Self-pay | Admitting: Family Medicine

## 2019-04-28 MED ORDER — BUPROPION HCL ER (SR) 150 MG PO TB12: 150.0000 mg | ORAL_TABLET | Freq: Two times a day (BID) | ORAL | 5 refills | Status: DC

## 2019-04-28 MED ORDER — ALPRAZOLAM 1 MG PO TABS: ORAL_TABLET | ORAL | 5 refills | Status: DC

## 2019-05-11 NOTE — Telephone Encounter (Signed)
Duplicate. It was taken care of

## 2019-08-02 ENCOUNTER — Other Ambulatory Visit: Payer: Self-pay | Admitting: Family Medicine

## 2019-08-17 ENCOUNTER — Encounter: Payer: Self-pay | Admitting: Family Medicine

## 2019-12-28 ENCOUNTER — Other Ambulatory Visit: Payer: Self-pay | Admitting: Family Medicine

## 2019-12-28 NOTE — Telephone Encounter (Signed)
lvm to schedule appt.  

## 2019-12-29 NOTE — Telephone Encounter (Signed)
lvm to schedule appt.  

## 2019-12-30 NOTE — Telephone Encounter (Signed)
lvm to schedule appt.  

## 2019-12-30 NOTE — Telephone Encounter (Signed)
Seen 03/25/19 for anxiety. Front tried calling 3 times and could not reach pt to set up an appt. Do you want to refuse this med and put a note to pharm to have pt schedule visit

## 2020-01-09 ENCOUNTER — Ambulatory Visit: Payer: BC Managed Care – PPO | Admitting: Family Medicine

## 2020-01-09 ENCOUNTER — Other Ambulatory Visit: Payer: Self-pay

## 2020-01-09 ENCOUNTER — Encounter: Payer: Self-pay | Admitting: Family Medicine

## 2020-01-09 VITALS — BP 126/82 | HR 68 | Temp 96.9°F | Ht 71.0 in | Wt 203.8 lb

## 2020-01-09 DIAGNOSIS — Z79899 Other long term (current) drug therapy: Secondary | ICD-10-CM | POA: Diagnosis not present

## 2020-01-09 DIAGNOSIS — F411 Generalized anxiety disorder: Secondary | ICD-10-CM | POA: Diagnosis not present

## 2020-01-09 MED ORDER — BUPROPION HCL ER (SR) 150 MG PO TB12: 150.0000 mg | ORAL_TABLET | Freq: Every day | ORAL | 1 refills | Status: DC

## 2020-01-09 MED ORDER — DICLOFENAC SODIUM 75 MG PO TBEC: DELAYED_RELEASE_TABLET | ORAL | 0 refills | Status: DC

## 2020-01-09 MED ORDER — ALPRAZOLAM 0.5 MG PO TABS: ORAL_TABLET | ORAL | 2 refills | Status: DC

## 2020-01-09 NOTE — Progress Notes (Signed)
Patient ID: Samuel Henry, male    DOB: 06/04/1961, 59 y.o.   MRN: [id number]   Chief Complaint  Patient presents with  . Medication Refill    Pt here today for medicaiton refill. Pt is taking Xanax, Wellbutrin, Voltarten PRN , Prilosed and Carafate as needed.    Subjective:    HPI Depression- taking wellbutrin 1 tab per day. Has been taking just 1 able daily for 1-2 yrs.  Xanax- for night time for sleep, 1/2- tabl 2-3 x per week.  Hasn't had any tablets for 3 weeks.  Pt has been on this longer than, 2012.  Daughter was killed in car accident many yrs ago in 2004, how this med got started.  Depression years later at least 2012. Getting home 1:30am from work, going to bed at [place] up at [place] tylenol pm, not working as quickly. occ headaches- was taking voltaren prn. Not had them recently.  Has h/o gerd- taking prilosec  Working in [place], working at [employer].   Medical History Edrick has a past medical history of Acid reflux, Depression, and History of kidney stones.   Outpatient Encounter Medications as of 01/09/2020  Medication Sig  . acetaminophen (TYLENOL) 500 MG tablet Take 500 mg by mouth every 6 (six) hours as needed.  . ALPRAZolam (XANAX) 0.5 MG tablet TAKE 1/2 TO 1 TABLET BY  MOUTH AT BEDTIME IF NEEDED  . buPROPion (WELLBUTRIN SR) 150 MG 12 hr tablet Take 1 tablet (150 mg total) by mouth daily.  . diclofenac (VOLTAREN) 75 MG EC tablet Take one up to bid prn headache  . omeprazole (PRILOSEC) 20 MG capsule TAKE 1 CAPSULE BY MOUTH  DAILY  . sucralfate (CARAFATE) 1 g tablet Take 1 tablet (1 g total) by mouth 4 (four) times daily -  with meals and at bedtime. In one ounce of water  . [DISCONTINUED] ALPRAZolam (XANAX) 1 MG tablet TAKE 1/2 TO 1 TABLET BY  MOUTH AT BEDTIME IF NEEDED  . [DISCONTINUED] buPROPion (WELLBUTRIN SR) 150 MG 12 hr tablet TAKE 1 TABLET BY MOUTH  TWICE DAILY  . [DISCONTINUED] diclofenac (VOLTAREN) 75 MG EC tablet Take one up to bid prn  headache   No facility-administered encounter medications on file as of 01/09/2020.     Review of Systems  Constitutional: Negative for chills and fever.  HENT: Negative for congestion, rhinorrhea and sore throat.   Respiratory: Negative for cough, shortness of breath and wheezing.   Cardiovascular: Negative for chest pain and leg swelling.  Gastrointestinal: Negative for abdominal pain, diarrhea, nausea and vomiting.  Genitourinary: Negative for dysuria and frequency.  Skin: Negative for rash.  Neurological: Negative for dizziness, weakness and headaches.  Psychiatric/Behavioral: Positive for sleep disturbance. Negative for agitation, behavioral problems, dysphoric mood, hallucinations, self-injury and suicidal ideas. The patient is not nervous/anxious.      Vitals BP 126/82   Pulse 68   Temp (!) 96.9 F (36.1 C)   Ht 5\' 11"  (1.803 m)   Wt 203 lb 12.8 oz (92.4 kg)   SpO2 100%   BMI 28.42 kg/m   Objective:   Physical Exam Vitals and nursing note reviewed.  Constitutional:      General: He is not in acute distress.    Appearance: Normal appearance. He is not ill-appearing.  HENT:     Head: Normocephalic.     Nose: Nose normal. No congestion.     Mouth/Throat:     Mouth: Mucous membranes are moist.  Pharynx: No oropharyngeal exudate.  Eyes:     Extraocular Movements: Extraocular movements intact.     Conjunctiva/sclera: Conjunctivae normal.     Pupils: Pupils are equal, round, and reactive to light.  Cardiovascular:     Rate and Rhythm: Normal rate and regular rhythm.     Pulses: Normal pulses.     Heart sounds: Normal heart sounds. No murmur heard.   Pulmonary:     Effort: Pulmonary effort is normal.     Breath sounds: Normal breath sounds. No wheezing, rhonchi or rales.  Musculoskeletal:        General: Normal range of motion.     Right lower leg: No edema.     Left lower leg: No edema.  Skin:    General: Skin is warm and dry.     Findings: No rash.    Neurological:     General: No focal deficit present.     Mental Status: He is alert and oriented to person, place, and time.     Cranial Nerves: No cranial nerve deficit.  Psychiatric:        Mood and Affect: Mood normal.        Behavior: Behavior normal.        Thought Content: Thought content normal.        Judgment: Judgment normal.      Assessment and Plan   1. Generalized anxiety disorder - buPROPion (WELLBUTRIN SR) 150 MG 12 hr tablet; Take 1 tablet (150 mg total) by mouth daily.  Dispense: 90 tablet; Refill: 1 - ALPRAZolam (XANAX) 0.5 MG tablet; TAKE 1/2 TO 1 TABLET BY  MOUTH AT BEDTIME IF NEEDED  Dispense: 30 tablet; Refill: 2  2. Long-term current use of benzodiazepine - ToxASSURE Select 13 (MW), Urine

## 2020-01-13 ENCOUNTER — Encounter: Payer: Self-pay | Admitting: Family Medicine

## 2020-01-13 LAB — TOXASSURE SELECT 13 (MW), URINE

## 2020-03-08 ENCOUNTER — Other Ambulatory Visit: Payer: Self-pay | Admitting: *Deleted

## 2020-03-08 MED ORDER — OMEPRAZOLE 20 MG PO CPDR: 20.0000 mg | DELAYED_RELEASE_CAPSULE | Freq: Every day | ORAL | 3 refills | Status: DC

## 2020-08-02 ENCOUNTER — Ambulatory Visit
Admission: RE | Admit: 2020-08-02 | Discharge: 2020-08-02 | Disposition: A | Discharge status: Home / Self Care | Payer: BC Managed Care – PPO | Source: Ambulatory Visit | Attending: Emergency Medicine | Admitting: Emergency Medicine

## 2020-08-02 ENCOUNTER — Other Ambulatory Visit: Payer: Self-pay

## 2020-08-02 VITALS — BP 121/69 | HR 66 | Temp 98.9°F | Resp 18

## 2020-08-02 DIAGNOSIS — L539 Erythematous condition, unspecified: Secondary | ICD-10-CM

## 2020-08-02 DIAGNOSIS — W540XXA Bitten by dog, initial encounter: Secondary | ICD-10-CM

## 2020-08-02 MED ORDER — AMOXICILLIN-POT CLAVULANATE 875-125 MG PO TABS: 1.0000 | ORAL_TABLET | Freq: Two times a day (BID) | ORAL | 0 refills | Status: DC

## 2020-08-02 MED ORDER — TETANUS-DIPHTH-ACELL PERTUSSIS 5-2.5-18.5 LF-MCG/0.5 IM SUSY
0.5000 mL | PREFILLED_SYRINGE | Freq: Once | INTRAMUSCULAR | Status: AC
  Administered 2020-08-02: 0.5 mL via INTRAMUSCULAR

## 2020-08-02 NOTE — Discharge Instructions (Addendum)
Tdap was given in office Wash with warm water and mild soap Augmentin prescribed.  Take as directed and to completion Follow up with PCP if symptoms persists Return or go to the ED if you have any new or worsening symptoms such as increasing redness, swelling, drainage, fever, chills, nausea, chest pain, SOB, etc..Marland Kitchen

## 2020-08-02 NOTE — ED Provider Notes (Addendum)
[facility]     Chief Complaint  Patient presents with  . Animal Bite    SUBJECTIVE:  Samuel Henry is a 60 y.o. male who presented to the urgent care with a complaint of dog bite to the right knee for the past 1 day.  Denies any precipitating event.  States dog immunization is up-to-date.  Has tried OTC medications without relief.  Denies alleviating or aggravating factors.  Denies similar symptoms in past.  Denies fever, chills, nausea, vomiting, diarrhea.  Td immunization is not up-to-date  LMP: No LMP for male patient.  ROS: As in HPI.  All other pertinent ROS negative.     Past Medical History:  Diagnosis Date  . Acid reflux   . Depression   . History of kidney stones    Past Surgical History:  Procedure Laterality Date  . CHONDROPLASTY Right 03/21/2013   Procedure: MEDIAL AND PATELLAR CHONDROPLASTY;  Surgeon: Carole Civil, MD;  Location: [facility];  Service: Orthopedics;  Laterality: Right;  . COLONOSCOPY N/A 01/30/2016   Procedure: COLONOSCOPY;  Surgeon: Daneil Dolin, MD;  Location: [facility];  Service: Endoscopy;  Laterality: N/A;  2:00 PM - pt can NOT move up  . ESOPHAGEAL DILATION N/A 01/30/2016   Procedure: ESOPHAGEAL DILATION;  Surgeon: Daneil Dolin, MD;  Location: [facility];  Service: Endoscopy;  Laterality: N/A;  . ESOPHAGOGASTRODUODENOSCOPY N/A 01/30/2016   Procedure: ESOPHAGOGASTRODUODENOSCOPY (EGD);  Surgeon: Daneil Dolin, MD;  Location: [facility];  Service: Endoscopy;  Laterality: N/A;  . KNEE ARTHROSCOPY WITH MEDIAL MENISECTOMY Right 03/21/2013   Procedure: KNEE ARTHROSCOPY WITH MEDIAL MENISECTOMY;  Surgeon: Carole Civil, MD;  Location: [facility];  Service: Orthopedics;  Laterality: Right;  . KNEE SURGERY Right 2010   Allergies  Allergen Reactions  . Nsaids Other (See Comments)    REACTION: flares reflux up   No current facility-administered medications on file prior to encounter.   Current Outpatient Medications on File  Prior to Encounter  Medication Sig Dispense Refill  . acetaminophen (TYLENOL) 500 MG tablet Take 500 mg by mouth every 6 (six) hours as needed.    . ALPRAZolam (XANAX) 0.5 MG tablet TAKE 1/2 TO 1 TABLET BY  MOUTH AT BEDTIME IF NEEDED 30 tablet 2  . buPROPion (WELLBUTRIN SR) 150 MG 12 hr tablet Take 1 tablet (150 mg total) by mouth daily. 90 tablet 1  . diclofenac (VOLTAREN) 75 MG EC tablet Take one up to bid prn headache 24 tablet 0  . omeprazole (PRILOSEC) 20 MG capsule Take 1 capsule (20 mg total) by mouth daily. 90 capsule 3  . sucralfate (CARAFATE) 1 g tablet Take 1 tablet (1 g total) by mouth 4 (four) times daily -  with meals and at bedtime. In one ounce of water 60 tablet 0   Social History   Socioeconomic History  . Marital status: Married    Spouse name: Not on file  . Number of children: Not on file  . Years of education: Not on file  . Highest education level: Not on file  Occupational History  . Not on file  Tobacco Use  . Smoking status: Never Smoker  . Smokeless tobacco: Never Used  Substance and Sexual Activity  . Alcohol use: No  . Drug use: No  . Sexual activity: Yes    Birth control/protection: None  Other Topics Concern  . Not on file  Social History Narrative  . Not on file   Social  Determinants of Health   Financial Resource Strain: Not on file  Food Insecurity: Not on file  Transportation Needs: Not on file  Physical Activity: Not on file  Stress: Not on file  Social Connections: Not on file  Intimate Partner Violence: Not on file   Family History  Problem Relation Age of Onset  . Hypertension Mother   . Hypertension Father   . Heart attack Father     OBJECTIVE:  Vitals:   08/02/20 1616  BP: 121/69  Pulse: 66  Resp: 18  Temp: 98.9 F (37.2 C)  TempSrc: Oral  SpO2: 98%   Physical Exam Vitals and nursing note reviewed.  Constitutional:      General: He is not in acute distress.    Appearance: Normal appearance. He is normal weight.  He is not ill-appearing, toxic-appearing or diaphoretic.  Cardiovascular:     Rate and Rhythm: Normal rate and regular rhythm.     Pulses: Normal pulses.     Heart sounds: Normal heart sounds. No murmur heard. No friction rub. No gallop.   Pulmonary:     Effort: Pulmonary effort is normal. No respiratory distress.     Breath sounds: Normal breath sounds. No stridor. No wheezing, rhonchi or rales.  Chest:     Chest wall: No tenderness.  Skin:    Findings: Erythema present.     Comments: Two open wound surrounded by erythema on the right knee  Neurological:     Mental Status: He is alert and oriented to person, place, and time.     Labs Reviewed - No data to display  ASSESSMENT & PLAN:  1. Dog bite, initial encounter   2. Skin erythema     Meds ordered this encounter  Medications  . Tdap (BOOSTRIX) injection 0.5 mL  . amoxicillin-clavulanate (AUGMENTIN) 875-125 MG tablet    Sig: Take 1 tablet by mouth every 12 (twelve) hours.    Dispense:  14 tablet    Refill:  0   Discharge instructions  Tdap was given in office Wash with warm water and mild soap Augmentin prescribed.  Take as directed and to completion Follow up with PCP if symptoms persists Return or go to the ED if you have any new or worsening symptoms such as increasing redness, swelling, drainage, fever, chills, nausea, chest pain, SOB, etc...  Outlined signs and symptoms indicating need for more acute intervention. Patient verbalized understanding. After Visit Summary given.     Emerson Monte, FNP 08/02/20 1636    Emerson Monte, FNP 08/02/20 [phone]

## 2020-08-02 NOTE — ED Triage Notes (Signed)
Pt's dog bit him last night on his RT knee.  Pt needs updated tetanus shot.

## 2020-08-03 ENCOUNTER — Telehealth: Payer: Self-pay | Admitting: Family Medicine

## 2020-08-03 DIAGNOSIS — Z13220 Encounter for screening for lipoid disorders: Secondary | ICD-10-CM

## 2020-08-03 DIAGNOSIS — F411 Generalized anxiety disorder: Secondary | ICD-10-CM

## 2020-08-03 DIAGNOSIS — Z79899 Other long term (current) drug therapy: Secondary | ICD-10-CM

## 2020-08-06 MED ORDER — ALPRAZOLAM 0.5 MG PO TABS
ORAL_TABLET | ORAL | 0 refills | Status: DC
Start: 2020-08-06 — End: 2020-08-17

## 2020-08-06 NOTE — Telephone Encounter (Signed)
Needs appt to follow up on this medication/anxiety. In next 20-30 days. Thx. Dr.Daved Mcfann

## 2020-08-06 NOTE — Addendum Note (Signed)
Addended by: Patsy Lager on: 08/06/2020 03:32 PM   Modules accepted: Orders

## 2020-08-06 NOTE — Telephone Encounter (Signed)
Pt needs appt in 20-30 days for more refills on this medication. Thx. Dr .Lovena Le

## 2020-08-07 NOTE — Telephone Encounter (Signed)
Sent my chart message to schedule appointment.

## 2020-08-07 NOTE — Telephone Encounter (Signed)
Please contact pt to schedule app in 20-30 days per Dr. Lovena Le

## 2020-08-10 NOTE — Telephone Encounter (Signed)
Patient has appointment on 2/4 for med check

## 2020-08-10 NOTE — Telephone Encounter (Signed)
Please advise. Thank you

## 2020-08-14 NOTE — Telephone Encounter (Signed)
Lab orders placed. Left message to return call  

## 2020-08-14 NOTE — Addendum Note (Signed)
Addended by: Vicente Males on: 08/14/2020 10:16 AM   Modules accepted: Orders

## 2020-08-14 NOTE — Telephone Encounter (Signed)
Pt hasn't had labs since 2015.  Pls have him get cbc, cmp, lipid panel. Thx. Dr. Lovena Le

## 2020-08-17 ENCOUNTER — Other Ambulatory Visit: Payer: Self-pay

## 2020-08-17 ENCOUNTER — Ambulatory Visit: Payer: BC Managed Care – PPO | Admitting: Family Medicine

## 2020-08-17 ENCOUNTER — Encounter: Payer: Self-pay | Admitting: Family Medicine

## 2020-08-17 VITALS — BP 124/86 | HR 83 | Temp 97.9°F | Ht 71.0 in | Wt 203.6 lb

## 2020-08-17 DIAGNOSIS — J019 Acute sinusitis, unspecified: Secondary | ICD-10-CM

## 2020-08-17 DIAGNOSIS — F32A Depression, unspecified: Secondary | ICD-10-CM

## 2020-08-17 DIAGNOSIS — F5101 Primary insomnia: Secondary | ICD-10-CM | POA: Diagnosis not present

## 2020-08-17 DIAGNOSIS — K219 Gastro-esophageal reflux disease without esophagitis: Secondary | ICD-10-CM | POA: Diagnosis not present

## 2020-08-17 DIAGNOSIS — F411 Generalized anxiety disorder: Secondary | ICD-10-CM

## 2020-08-17 MED ORDER — ALPRAZOLAM 0.5 MG PO TABS: ORAL_TABLET | ORAL | 2 refills | Status: DC

## 2020-08-17 MED ORDER — OMEPRAZOLE 20 MG PO CPDR
20.0000 mg | DELAYED_RELEASE_CAPSULE | Freq: Every day | ORAL | 3 refills | Status: AC
Start: 2020-08-17 — End: ?

## 2020-08-17 MED ORDER — CEFDINIR 300 MG PO CAPS: 300.0000 mg | ORAL_CAPSULE | Freq: Two times a day (BID) | ORAL | 0 refills | Status: DC

## 2020-08-17 MED ORDER — BUPROPION HCL ER (SR) 150 MG PO TB12: 150.0000 mg | ORAL_TABLET | Freq: Every day | ORAL | 1 refills | Status: DC

## 2020-08-17 NOTE — Telephone Encounter (Signed)
Pt has appt today

## 2020-08-17 NOTE — Progress Notes (Addendum)
   Patient ID: Samuel Henry, male    DOB: [birth year], 60 y.o.   MRN: [id number]   Chief Complaint  Patient presents with  . Anxiety    Follow up  . Depression    Follow up   Subjective:

## 2020-09-02 NOTE — Progress Notes (Signed)
Patient ID: Samuel Henry, male    DOB: March 20, 1961, 60 y.o.   MRN: [id number]   Chief Complaint  Patient presents with  . Anxiety    Follow up  . Depression    Follow up   Subjective:    HPI Pt seen for f/u anxiety and depression.  Has sinus issues and occ headaches.  No fever or drainage from head.  Using sinus spray and tylenol and decongestant. Seen someone in 1/22 for dog bite And was given augmentin, for dog bite, and seen at urgent care.  Pt never took the medication.   Anxiety/depression- Taking wellbutrin daily. Taking xanax prn.  mostly taking it 2x per week and just at night.  Reflux medication daily for gerd. Doing well.  No new concerns.  occ needing it often, carafate. Hasn't had it in months.  Diclofenac for headaches prn, if not better with tylenol.  Only prn.  Pt supposed to get colonoscopy. Pt wanting to call back when ready for the consult.  Pt on small amt xanax prn. Usually taking at night. Started on it many yrs ago, due to his daughter was killed in car accident many years ago in 2004.  Pt doing well with wellbutrin for the depression.  Medical History Samuel Henry has a past medical history of Acid reflux, Depression, and History of kidney stones.   Outpatient Encounter Medications as of 08/17/2020  Medication Sig  . acetaminophen (TYLENOL) 500 MG tablet Take 500 mg by mouth every 6 (six) hours as needed.  . ALPRAZolam (XANAX) 0.5 MG tablet TAKE 1/2 TO 1 TABLET BY MOUTH AT BEDTIME AS NEEDED  . buPROPion (WELLBUTRIN SR) 150 MG 12 hr tablet Take 1 tablet (150 mg total) by mouth daily.  . cefdinir (OMNICEF) 300 MG capsule Take 1 capsule (300 mg total) by mouth 2 (two) times daily. For 10 days  . diclofenac (VOLTAREN) 75 MG EC tablet Take one up to bid prn headache  . omeprazole (PRILOSEC) 20 MG capsule Take 1 capsule (20 mg total) by mouth daily.  . sucralfate (CARAFATE) 1 g tablet Take 1 tablet (1 g total) by mouth 4 (four) times daily -  with meals and  at bedtime. In one ounce of water (Patient not taking: Reported on 08/17/2020)  . [DISCONTINUED] ALPRAZolam (XANAX) 0.5 MG tablet TAKE 1/2 TO 1 TABLET BY MOUTH AT BEDTIME AS NEEDED  . [DISCONTINUED] amoxicillin-clavulanate (AUGMENTIN) 875-125 MG tablet Take 1 tablet by mouth every 12 (twelve) hours. (Patient not taking: Reported on 08/17/2020)  . [DISCONTINUED] buPROPion (WELLBUTRIN SR) 150 MG 12 hr tablet Take 1 tablet (150 mg total) by mouth daily.  . [DISCONTINUED] omeprazole (PRILOSEC) 20 MG capsule Take 1 capsule (20 mg total) by mouth daily.   No facility-administered encounter medications on file as of 08/17/2020.     Review of Systems  Constitutional: Negative for chills and fever.  HENT: Negative for congestion, rhinorrhea and sore throat.   Respiratory: Negative for cough, shortness of breath and wheezing.   Cardiovascular: Negative for chest pain and leg swelling.  Gastrointestinal: Negative for abdominal pain, diarrhea, nausea and vomiting.  Genitourinary: Negative for dysuria and frequency.  Skin: Negative for rash.  Neurological: Negative for dizziness, weakness and headaches.  Psychiatric/Behavioral: Negative for dysphoric mood, self-injury, sleep disturbance and suicidal ideas. The patient is not nervous/anxious- improved.      Vitals BP 124/86   Pulse 83   Temp 97.9 F (36.6 C) (Oral)   Ht 5\' 11"  (1.803 m)  Wt 203 lb 9.6 oz (92.4 kg)   SpO2 100%   BMI 28.40 kg/m   Objective:   Physical Exam Vitals and nursing note reviewed.  Constitutional:      General: He is not in acute distress.    Appearance: Normal appearance. He is not ill-appearing.  HENT:     Head: Normocephalic.     Nose: Nose normal. No congestion.     Mouth/Throat:     Mouth: Mucous membranes are moist.     Pharynx: No oropharyngeal exudate.  Eyes:     Extraocular Movements: Extraocular movements intact.     Conjunctiva/sclera: Conjunctivae normal.     Pupils: Pupils are equal, round, and  reactive to light.  Cardiovascular:     Rate and Rhythm: Normal rate and regular rhythm.     Pulses: Normal pulses.     Heart sounds: Normal heart sounds. No murmur heard.   Pulmonary:     Effort: Pulmonary effort is normal.     Breath sounds: Normal breath sounds. No wheezing, rhonchi or rales.  Musculoskeletal:        General: Normal range of motion.     Right lower leg: No edema.     Left lower leg: No edema.  Skin:    General: Skin is warm and dry.     Findings: No rash.  Neurological:     General: No focal deficit present.     Mental Status: He is alert and oriented to person, place, and time.     Cranial Nerves: No cranial nerve deficit.  Psychiatric:        Mood and Affect: Mood normal.        Behavior: Behavior normal.        Thought Content: Thought content normal.        Judgment: Judgment normal.      Assessment and Plan   1. Generalized anxiety disorder - buPROPion (WELLBUTRIN SR) 150 MG 12 hr tablet; Take 1 tablet (150 mg total) by mouth daily.  Dispense: 90 tablet; Refill: 1 - ALPRAZolam (XANAX) 0.5 MG tablet; TAKE 1/2 TO 1 TABLET BY MOUTH AT BEDTIME AS NEEDED  Dispense: 30 tablet; Refill: 2  2. Primary insomnia  3. Depression, unspecified depression type  4. Gastroesophageal reflux disease, unspecified whether esophagitis present - omeprazole (PRILOSEC) 20 MG capsule; Take 1 capsule (20 mg total) by mouth daily.  Dispense: 90 capsule; Refill: 3  5. Acute non-recurrent sinusitis, unspecified location - cefdinir (OMNICEF) 300 MG capsule; Take 1 capsule (300 mg total) by mouth 2 (two) times daily. For 10 days  Dispense: 20 capsule; Refill: 0   Anxiety- Pt doing well on taper down on xanax from 1mg  to 0.5mg .  Pt taking 1/2-1 tab prn at night.  Rarely needing it during the day. Pt stating taking it 2x per week prn anxiety/insomnia. Stable.  Will cont with medications.   Depression- stable.  Cont with wellbutrin.  gerd- stable. Cont meds.  Sinusitis-  gave course of cefdinir, pt not able to tolerate augmentin. Pt to stop augmentin and start the cefdinir.  Call if not improving.  F/u 6mo or prn

## 2021-02-02 ENCOUNTER — Other Ambulatory Visit: Payer: Self-pay | Admitting: Family Medicine

## 2021-02-02 DIAGNOSIS — F411 Generalized anxiety disorder: Secondary | ICD-10-CM

## 2021-02-08 ENCOUNTER — Telehealth: Payer: Self-pay | Admitting: Family Medicine

## 2021-02-08 DIAGNOSIS — F411 Generalized anxiety disorder: Secondary | ICD-10-CM

## 2021-02-11 NOTE — Telephone Encounter (Signed)
Needs appt for f/u on this medication. Dr. Lovena Le

## 2021-02-11 NOTE — Telephone Encounter (Signed)
Pt needs appt. Thank you!

## 2021-02-12 NOTE — Telephone Encounter (Signed)
Sent mychart message

## 2021-02-19 NOTE — Telephone Encounter (Signed)
2nd my chart message

## 2021-03-01 ENCOUNTER — Telehealth: Payer: Self-pay | Admitting: Family Medicine

## 2021-03-01 DIAGNOSIS — Z79899 Other long term (current) drug therapy: Secondary | ICD-10-CM

## 2021-03-01 DIAGNOSIS — Z13220 Encounter for screening for lipoid disorders: Secondary | ICD-10-CM

## 2021-03-01 NOTE — Telephone Encounter (Signed)
Patient wants to know if he need s lab done for medication follow up

## 2021-03-05 NOTE — Telephone Encounter (Signed)
   Pls order cbc, cmp, lipids. Thx. Dr. Lovena Le

## 2021-03-05 NOTE — Telephone Encounter (Signed)
Blood work ordered in Epic. Patient notified. 

## 2021-03-06 NOTE — Telephone Encounter (Signed)
Appointment 9/8

## 2021-03-14 DIAGNOSIS — Z79899 Other long term (current) drug therapy: Secondary | ICD-10-CM | POA: Diagnosis not present

## 2021-03-14 DIAGNOSIS — Z13220 Encounter for screening for lipoid disorders: Secondary | ICD-10-CM | POA: Diagnosis not present

## 2021-03-15 ENCOUNTER — Encounter (HOSPITAL_COMMUNITY): Payer: Self-pay

## 2021-03-15 ENCOUNTER — Emergency Department (HOSPITAL_COMMUNITY)
Admission: EM | Admit: 2021-03-15 | Discharge: 2021-03-15 | Disposition: A | Discharge status: Home / Self Care | Payer: BC Managed Care – PPO | Attending: Emergency Medicine | Admitting: Emergency Medicine

## 2021-03-15 ENCOUNTER — Other Ambulatory Visit: Payer: Self-pay

## 2021-03-15 DIAGNOSIS — Y9289 Other specified places as the place of occurrence of the external cause: Secondary | ICD-10-CM | POA: Diagnosis not present

## 2021-03-15 DIAGNOSIS — S0181XA Laceration without foreign body of other part of head, initial encounter: Secondary | ICD-10-CM

## 2021-03-15 DIAGNOSIS — W228XXA Striking against or struck by other objects, initial encounter: Secondary | ICD-10-CM | POA: Insufficient documentation

## 2021-03-15 DIAGNOSIS — Y93H2 Activity, gardening and landscaping: Secondary | ICD-10-CM | POA: Insufficient documentation

## 2021-03-15 DIAGNOSIS — S0992XA Unspecified injury of nose, initial encounter: Secondary | ICD-10-CM | POA: Diagnosis not present

## 2021-03-15 DIAGNOSIS — S0121XA Laceration without foreign body of nose, initial encounter: Secondary | ICD-10-CM | POA: Diagnosis not present

## 2021-03-15 LAB — CBC WITH DIFFERENTIAL/PLATELET
Basophils Absolute: 0.1 10*3/uL (ref 0.0–0.2)
Basos: 1 %
EOS (ABSOLUTE): 0 10*3/uL (ref 0.0–0.4)
Eos: 0 %
Hematocrit: 40.2 % (ref 37.5–51.0)
Hemoglobin: 13.7 g/dL (ref 13.0–17.7)
Immature Grans (Abs): 0.1 10*3/uL (ref 0.0–0.1)
Immature Granulocytes: 1 %
Lymphocytes Absolute: 0.9 10*3/uL (ref 0.7–3.1)
Lymphs: 17 %
MCH: 30.1 pg (ref 26.6–33.0)
MCHC: 34.1 g/dL (ref 31.5–35.7)
MCV: 88 fL (ref 79–97)
Monocytes Absolute: 0.4 10*3/uL (ref 0.1–0.9)
Monocytes: 7 %
Neutrophils Absolute: 4 10*3/uL (ref 1.4–7.0)
Neutrophils: 74 %
Platelets: 175 10*3/uL (ref 150–450)
RBC: 4.55 x10E6/uL (ref 4.14–5.80)
RDW: 12.1 % (ref 11.6–15.4)
WBC: 5.4 10*3/uL (ref 3.4–10.8)

## 2021-03-15 LAB — COMPREHENSIVE METABOLIC PANEL
ALT: 16 IU/L (ref 0–44)
AST: 20 IU/L (ref 0–40)
Albumin/Globulin Ratio: 2.1 (ref 1.2–2.2)
Albumin: 4.5 g/dL (ref 3.8–4.9)
Alkaline Phosphatase: 98 IU/L (ref 44–121)
BUN/Creatinine Ratio: 19 (ref 9–20)
BUN: 20 mg/dL (ref 6–24)
Bilirubin Total: 0.4 mg/dL (ref 0.0–1.2)
CO2: 22 mmol/L (ref 20–29)
Calcium: 9.1 mg/dL (ref 8.7–10.2)
Chloride: 105 mmol/L (ref 96–106)
Creatinine, Ser: 1.05 mg/dL (ref 0.76–1.27)
Globulin, Total: 2.1 g/dL (ref 1.5–4.5)
Glucose: 96 mg/dL (ref 65–99)
Potassium: 4.2 mmol/L (ref 3.5–5.2)
Sodium: 139 mmol/L (ref 134–144)
Total Protein: 6.6 g/dL (ref 6.0–8.5)
eGFR: 82 mL/min/{1.73_m2} (ref >59)

## 2021-03-15 LAB — LIPID PANEL
Chol/HDL Ratio: 4.1 ratio (ref 0.0–5.0)
Cholesterol, Total: 162 mg/dL (ref 100–199)
HDL: 40 mg/dL (ref >39)
LDL Chol Calc (NIH): 111 mg/dL — ABNORMAL HIGH (ref 0–99)
Triglycerides: 55 mg/dL (ref 0–149)
VLDL Cholesterol Cal: 11 mg/dL (ref 5–40)

## 2021-03-15 MED ORDER — LIDOCAINE HCL (PF) 1 % IJ SOLN
5.0000 mL | Freq: Once | INTRAMUSCULAR | Status: AC
  Administered 2021-03-15: 5 mL
  Filled 2021-03-15: qty 30

## 2021-03-15 NOTE — Discharge Instructions (Addendum)
Sutured repair Keep the laceration site dry for the next 24 hours and leave the dressing in place. After 24 hours you may remove the dressing and gently clean the laceration site with antibacterial soap and warm water. Do not scrub the area. Do not soak the area and water for long periods of time. Don't use hydrogen peroxide, iodine-based solutions, or alcohol, which can slow healing, and will probably be painful!    Return to the emergency department in 7 days for removal of the sutures.  You should return sooner for any signs of infection which would include increased redness around the wound, increased swelling, new drainage of yellow pus.

## 2021-03-15 NOTE — ED Notes (Signed)
Suture cart at bedside 

## 2021-03-15 NOTE — ED Triage Notes (Signed)
Pt presents to ED with complaints of laceration to right side of nose. Pt states he was mowing and a tree limb hit him in the side of the nose.

## 2021-03-15 NOTE — ED Provider Notes (Signed)
[facility] EMERGENCY DEPARTMENT Provider Note   CSN: [id number] Arrival date & time: 03/15/21  1826     History Chief Complaint  Patient presents with   Laceration    Samuel Henry is a 60 y.o. male.    Patient presents to the ED after sustaining a laceration to his right side of his nose while mowing.  He states that he ran into a tree branch.  He had his last tetanus shot in January 2022.  He denies any facial pain or headaches.  He is not on Blood thinners  Laceration     Past Medical History:  Diagnosis Date   Acid reflux    Depression    History of kidney stones     Patient Active Problem List   Diagnosis Date Noted   Special screening for malignant neoplasms, colon    History of colonic polyps    History of migraine headaches 09/03/2013   Tinnitus 09/03/2013   Stiffness of joint, not elsewhere classified, lower leg 03/28/2013   Leg weakness 03/28/2013   S/P right knee arthroscopy 03/24/2013   Medial meniscus, posterior horn derangement 03/24/2013   Acute medial meniscus tear of left knee 01/12/2013   Generalized anxiety disorder 10/08/2012   Insomnia 10/08/2012   GERD (gastroesophageal reflux disease) 10/01/2012   ARTHROSCOPY, RIGHT KNEE, HX OF 07/03/2010   ARTHRITIS, RIGHT KNEE 06/18/2010   LOOSE BODY-KNEE 06/18/2010   OLD DISRUPTION OF ANTERIOR CRUCIATE LIGAMENT 06/18/2010   DERANGEMENT OF POSTERIOR HORN OF MEDIAL MENISCUS 05/28/2010   KNEE PAIN 05/28/2010    Past Surgical History:  Procedure Laterality Date   CHONDROPLASTY Right 03/21/2013   Procedure: MEDIAL AND PATELLAR CHONDROPLASTY;  Surgeon: Carole Civil, MD;  Location: [facility];  Service: Orthopedics;  Laterality: Right;   COLONOSCOPY N/A 01/30/2016   Procedure: COLONOSCOPY;  Surgeon: Daneil Dolin, MD;  Location: [facility];  Service: Endoscopy;  Laterality: N/A;  2:00 PM - pt can NOT move up   ESOPHAGEAL DILATION N/A 01/30/2016   Procedure: ESOPHAGEAL DILATION;  Surgeon: Daneil Dolin,  MD;  Location: [facility];  Service: Endoscopy;  Laterality: N/A;   ESOPHAGOGASTRODUODENOSCOPY N/A 01/30/2016   Procedure: ESOPHAGOGASTRODUODENOSCOPY (EGD);  Surgeon: Daneil Dolin, MD;  Location: [facility];  Service: Endoscopy;  Laterality: N/A;   KNEE ARTHROSCOPY WITH MEDIAL MENISECTOMY Right 03/21/2013   Procedure: KNEE ARTHROSCOPY WITH MEDIAL MENISECTOMY;  Surgeon: Carole Civil, MD;  Location: [facility];  Service: Orthopedics;  Laterality: Right;   KNEE SURGERY Right 2010       Family History  Problem Relation Age of Onset   Hypertension Mother    Hypertension Father    Heart attack Father     Social History   Tobacco Use   Smoking status: Never   Smokeless tobacco: Never  Substance Use Topics   Alcohol use: No   Drug use: No    Home Medications Prior to Admission medications   Medication Sig Start Date End Date Taking? Authorizing Provider  acetaminophen (TYLENOL) 500 MG tablet Take 500 mg by mouth every 6 (six) hours as needed.   Yes [provider]  ALPRAZolam Duanne Moron) 0.5 MG tablet TAKE 1/2 TO 1 TABLET BY MOUTH AT BEDTIME AS NEEDED 08/17/20  Yes Lovena Le, Malena M, DO  buPROPion (WELLBUTRIN SR) 150 MG 12 hr tablet Take 1 tablet (150 mg total) by mouth daily. APPT REQUIRED 02/04/21  Yes Lovena Le, Malena M, DO  diphenhydramine-acetaminophen (TYLENOL PM) 25-500 MG TABS tablet Take  1 tablet by mouth at bedtime as needed.   Yes [provider]  FLUoxetine (PROZAC) 10 MG tablet Take 10 mg by mouth daily. 03/08/21  Yes [provider]  omeprazole (PRILOSEC) 20 MG capsule Take 1 capsule (20 mg total) by mouth daily. 08/17/20  Yes Taylor, Malena M, DO  OVER THE COUNTER MEDICATION Take 1 tablet by mouth daily. Flinstone with iron   Yes [provider]  traZODone (DESYREL) 50 MG tablet Take 50 mg by mouth at bedtime as needed. 02/28/21  Yes [provider]  diclofenac (VOLTAREN) 75 MG EC tablet Take one up to bid prn headache Patient not  taking: No sig reported 01/09/20   Lovena Le, Malena M, DO  escitalopram (LEXAPRO) 10 MG tablet Take 10 mg by mouth daily. Patient not taking: No sig reported 02/08/21   [provider]  hydrOXYzine (ATARAX/VISTARIL) 10 MG tablet Take 10-20 mg by mouth every 6 (six) hours as needed. Patient not taking: No sig reported 02/08/21   [provider]  sucralfate (CARAFATE) 1 g tablet Take 1 tablet (1 g total) by mouth 4 (four) times daily -  with meals and at bedtime. In one ounce of water Patient not taking: No sig reported 03/23/18   Mikey Kirschner, MD    Allergies    Nsaids  Review of Systems   Review of Systems  HENT:  Negative for facial swelling.   Skin:  Positive for wound.  Neurological:  Negative for headaches.   Physical Exam Updated Vital Signs BP 130/87 (BP Location: Right Arm)   Pulse 69   Temp 97.9 F (36.6 C) (Oral)   Resp 18   Ht '5\' 10"'$  (1.778 m)   Wt 86.5 kg   SpO2 96%   BMI 27.35 kg/m   Physical Exam Vitals and nursing note reviewed.  Constitutional:      General: He is not in acute distress.    Appearance: Normal appearance. He is not ill-appearing, toxic-appearing or diaphoretic.  HENT:     Head: Normocephalic and atraumatic.     Nose: Laceration present.     Comments: Laceration to right nare near the nasofacial fold. Does not extend through and through to the inside of the nare. About 0.5 cm long.   Eyes:     General: No scleral icterus.       Right eye: No discharge.        Left eye: No discharge.     Conjunctiva/sclera: Conjunctivae normal.  Pulmonary:     Effort: Pulmonary effort is normal. No respiratory distress.  Neurological:     Mental Status: He is alert.  Psychiatric:        Mood and Affect: Mood normal.        Behavior: Behavior normal.    ED Results / Procedures / Treatments   Labs (all labs ordered are listed, but only abnormal results are displayed) Labs Reviewed - No data to display  EKG None  Radiology No  results found.  Procedures .Marland KitchenLaceration Repair  Date/Time: 03/15/2021 8:23 PM Performed by: Adolphus Birchwood, PA-C Authorized by: Adolphus Birchwood, PA-C   Consent:    Consent obtained:  Verbal   Consent given by:  Patient and spouse   Risks, benefits, and alternatives were discussed: yes     Risks discussed:  Pain and infection   Alternatives discussed:  No treatment Universal protocol:    Procedure explained and questions answered to patient or proxy's satisfaction: yes  Patient identity confirmed:  Verbally with patient Anesthesia:    Anesthesia method:  Local infiltration   Local anesthetic:  Lidocaine 1% w/o epi Laceration details:    Location:  Face   Face location:  Nose   Length (cm):  0.5   Depth (mm):  0.5 Treatment:    Area cleansed with:  Chlorhexidine   Amount of cleaning:  Standard Skin repair:    Repair method:  Sutures and tissue adhesive   Suture size:  6-0   Suture material:  Prolene   Suture technique:  Simple interrupted   Number of sutures:  3 Repair type:    Repair type:  Simple Post-procedure details:    Dressing:  Open (no dressing)   Procedure completion:  Tolerated well, no immediate complications   Medications Ordered in ED Medications  lidocaine (PF) (XYLOCAINE) 1 % injection 5 mL (5 mLs Infiltration Given 03/15/21 2000)    ED Course  I have reviewed the triage vital signs and the nursing notes.  Pertinent labs & imaging results that were available during my care of the patient were reviewed by me and considered in my medical decision making (see chart for details).    MDM Rules/Calculators/A&P                           Well appearing 60 year old male with a facial lac to right nare. Last tetanus was within the past year so no need to give today. No concern for fracture. Laceration repair as described above. Three suture used. There is very low risk for infection with lacerations to the face so no need for antibiotics.  Patient given  discharge instructions for home care. .    Final Clinical Impression(s) / ED Diagnoses Final diagnoses:  Facial laceration, initial encounter    Rx / DC Orders ED Discharge Orders     None        Adolphus Birchwood, PA-C 03/15/21 2030    Margette Fast, MD 03/19/21 1749

## 2021-03-21 ENCOUNTER — Other Ambulatory Visit: Payer: Self-pay

## 2021-03-21 ENCOUNTER — Ambulatory Visit: Payer: BC Managed Care – PPO | Admitting: Family Medicine

## 2021-03-21 ENCOUNTER — Encounter: Payer: Self-pay | Admitting: Family Medicine

## 2021-03-21 VITALS — BP 123/79 | HR 64 | Temp 98.1°F | Wt 195.2 lb

## 2021-03-21 DIAGNOSIS — F411 Generalized anxiety disorder: Secondary | ICD-10-CM | POA: Diagnosis not present

## 2021-03-21 DIAGNOSIS — F5101 Primary insomnia: Secondary | ICD-10-CM | POA: Diagnosis not present

## 2021-03-21 MED ORDER — ALPRAZOLAM 0.5 MG PO TABS: ORAL_TABLET | ORAL | 2 refills | Status: DC

## 2021-03-21 MED ORDER — BUPROPION HCL ER (SR) 150 MG PO TB12: 150.0000 mg | ORAL_TABLET | Freq: Every day | ORAL | 0 refills | Status: DC

## 2021-03-21 NOTE — Progress Notes (Signed)
Patient ID: Samuel Henry, male    DOB: 1960-12-10, 60 y.o.   MRN: [id number]   Chief Complaint  Patient presents with   Anxiety   Subjective:    HPI  Pt here for medication follow up for anxiety and insomnia. Pt taking Wellbutrin, Prozac, Xanax prn. Using Tylenol PM to help and also takes Trazodone prn.  Long standing anxiety and insomnia, had daughter that died in a tragic car accident in 2004.  Pt has tapered down from '1mg'$  xanax to 0.'5mg'$  tablet over the past 1.5 yrs.  Problems with sleeping still.  No new stressors at home.   At times hard time going to sleep.  Mind racing at times. Tried hydroxyzine for sleep.  Made him groggy thorugh day. Tried "trazodone." Was getting this from NP at his work. Tried 2 hrs prior or at bedtime.  Using tylenol pm- to get to sleep.  Used to take 2 per night.  Not using the xanax.   Working 5p-1am, shift.   Going to bed around 2am,  usually trying ot get up at 9-9:30am. Used to sleep at least 6 hrs.  Has slowly gotten better.   Prozac '10mg'$  and only on it for 2 wks.  From the NP at his work.  Pt still on wellbutrin. Pt tried lexapro, had side effects and discontinued.  Tried it for 3 wks. Weaned off it then onto prozac.  On weekend getting good sleep, when not working.  Prefers working night shift.  So not wanting a day shift at this time.  \anxiety- took 1 tab, had once of anxiety needing.  Occ taking during day.  Mostly taking at night for sleep.  Pt due to colonoscopy.  Taking iron with his multivitamin. Not working since Hb at 12.9. Iron did come up.   Medical History Prophet has a past medical history of Acid reflux, Depression, and History of kidney stones.   Outpatient Encounter Medications as of 03/21/2021  Medication Sig   acetaminophen (TYLENOL) 500 MG tablet Take 500 mg by mouth every 6 (six) hours as needed.   diclofenac (VOLTAREN) 75 MG EC tablet Take one up to bid prn headache   diphenhydramine-acetaminophen  (TYLENOL PM) 25-500 MG TABS tablet Take 1 tablet by mouth at bedtime as needed.   FLUoxetine (PROZAC) 10 MG tablet Take 10 mg by mouth daily.   omeprazole (PRILOSEC) 20 MG capsule Take 1 capsule (20 mg total) by mouth daily.   OVER THE COUNTER MEDICATION Take 1 tablet by mouth daily. Flinstone with iron   sucralfate (CARAFATE) 1 g tablet Take 1 tablet (1 g total) by mouth 4 (four) times daily -  with meals and at bedtime. In one ounce of water   traZODone (DESYREL) 50 MG tablet Take 50 mg by mouth at bedtime as needed.   [DISCONTINUED] ALPRAZolam (XANAX) 0.5 MG tablet TAKE 1/2 TO 1 TABLET BY MOUTH AT BEDTIME AS NEEDED   [DISCONTINUED] buPROPion (WELLBUTRIN SR) 150 MG 12 hr tablet Take 1 tablet (150 mg total) by mouth daily. APPT REQUIRED   ALPRAZolam (XANAX) 0.5 MG tablet TAKE 1/2 TO 1 TABLET BY MOUTH AT BEDTIME AS NEEDED   buPROPion (WELLBUTRIN SR) 150 MG 12 hr tablet Take 1 tablet (150 mg total) by mouth daily.   [DISCONTINUED] escitalopram (LEXAPRO) 10 MG tablet Take 10 mg by mouth daily. (Patient not taking: No sig reported)   [DISCONTINUED] hydrOXYzine (ATARAX/VISTARIL) 10 MG tablet Take 10-20 mg by mouth every 6 (six) hours as  needed. (Patient not taking: No sig reported)   No facility-administered encounter medications on file as of 03/21/2021.     Review of Systems Neg unless otherwise noted.  Vitals BP 123/79   Pulse 64   Temp 98.1 F (36.7 C)   Wt 195 lb 3.2 oz (88.5 kg)   SpO2 99%   BMI 28.01 kg/m   Objective:   Physical Exam Vitals and nursing note reviewed.  Constitutional:      General: He is not in acute distress.    Appearance: Normal appearance. He is not ill-appearing.  HENT:     Head: Normocephalic.     Nose: Nose normal. No congestion.     Mouth/Throat:     Mouth: Mucous membranes are moist.     Pharynx: No oropharyngeal exudate.  Eyes:     Extraocular Movements: Extraocular movements intact.     Conjunctiva/sclera: Conjunctivae normal.     Pupils:  Pupils are equal, round, and reactive to light.  Cardiovascular:     Rate and Rhythm: Normal rate and regular rhythm.     Pulses: Normal pulses.     Heart sounds: Normal heart sounds. No murmur heard. Pulmonary:     Effort: Pulmonary effort is normal.     Breath sounds: Normal breath sounds. No wheezing, rhonchi or rales.  Musculoskeletal:        General: Normal range of motion.     Right lower leg: No edema.     Left lower leg: No edema.  Skin:    General: Skin is warm and dry.     Findings: No rash.  Neurological:     General: No focal deficit present.     Mental Status: He is alert and oriented to person, place, and time.     Cranial Nerves: No cranial nerve deficit.  Psychiatric:        Mood and Affect: Mood normal.        Behavior: Behavior normal.        Thought Content: Thought content normal.        Judgment: Judgment normal.     Assessment and Plan   1. Generalized anxiety disorder - buPROPion (WELLBUTRIN SR) 150 MG 12 hr tablet; Take 1 tablet (150 mg total) by mouth daily.  Dispense: 90 tablet; Refill: 0 - ALPRAZolam (XANAX) 0.5 MG tablet; TAKE 1/2 TO 1 TABLET BY MOUTH AT BEDTIME AS NEEDED  Dispense: 30 tablet; Refill: 2  2. Primary insomnia   Pt to call GI for the repeat colonoscopy over due since 7/22. Advising to just take the xanax prn sparingly and try to take the trazodone. Cont with prozac and wellbutrin.  Pt only on '10mg'$  prozac, would recommend pt f/u with his NP at work and see about increasing this on next visit.  Pt in agreement. F/u with new provider in next 2-3 months.   Return in about 2 months (around 05/21/2021).

## 2021-04-08 DIAGNOSIS — N5201 Erectile dysfunction due to arterial insufficiency: Secondary | ICD-10-CM | POA: Diagnosis not present

## 2021-04-08 DIAGNOSIS — N200 Calculus of kidney: Secondary | ICD-10-CM | POA: Diagnosis not present

## 2021-04-20 ENCOUNTER — Other Ambulatory Visit: Payer: Self-pay | Admitting: Family Medicine

## 2021-04-20 DIAGNOSIS — F411 Generalized anxiety disorder: Secondary | ICD-10-CM

## 2021-07-04 ENCOUNTER — Encounter: Payer: Self-pay | Admitting: Nurse Practitioner

## 2021-07-04 ENCOUNTER — Ambulatory Visit (INDEPENDENT_AMBULATORY_CARE_PROVIDER_SITE_OTHER): Payer: BC Managed Care – PPO | Admitting: Nurse Practitioner

## 2021-07-04 ENCOUNTER — Other Ambulatory Visit: Payer: Self-pay

## 2021-07-04 VITALS — BP 126/74 | HR 70 | Temp 98.1°F | Ht 70.0 in | Wt 202.4 lb

## 2021-07-04 DIAGNOSIS — H6122 Impacted cerumen, left ear: Secondary | ICD-10-CM | POA: Diagnosis not present

## 2021-07-04 DIAGNOSIS — R6889 Other general symptoms and signs: Secondary | ICD-10-CM | POA: Diagnosis not present

## 2021-07-04 NOTE — Progress Notes (Signed)
° °  Subjective:    Patient ID: Samuel Henry, male    DOB: 11-26-1960, 60 y.o.   MRN: [id number]  HPI Patient report to clinic with cough, congestion, sore throat, left ear pain, running nose, low grade temp, body aches, and chest tightness since this morning.  Patient denies any chest pain, SOB, or difficulty breathing. Taking mucinex, tylenol, and robitussin which has been helpful.  Patient admits that 2 of grandchildren had the flu this past weekend.    Review of Systems  Constitutional:  Positive for chills, fatigue and fever.  HENT:  Positive for congestion, ear pain, rhinorrhea and sore throat. Negative for ear discharge, facial swelling, hearing loss, postnasal drip, sinus pressure, sinus pain and trouble swallowing.   Eyes:  Negative for pain and discharge.  Respiratory:  Positive for chest tightness. Negative for cough, shortness of breath and wheezing.   Cardiovascular:  Negative for chest pain and palpitations.  Gastrointestinal:  Negative for diarrhea, nausea and vomiting.  Neurological:  Negative for syncope, weakness, light-headedness and headaches.      Objective:   Physical Exam Constitutional:      General: He is not in acute distress.    Appearance: Normal appearance. He is normal weight. He is not ill-appearing, toxic-appearing or diaphoretic.  HENT:     Head: Normocephalic.     Right Ear: Tympanic membrane, ear canal and external ear normal. There is no impacted cerumen.     Left Ear: Tympanic membrane, ear canal and external ear normal. There is impacted cerumen.     Ears:     Comments: Left ear partially impacted    Nose: Nose normal.     Mouth/Throat:     Mouth: Mucous membranes are moist.     Pharynx: No oropharyngeal exudate or posterior oropharyngeal erythema.  Eyes:     Conjunctiva/sclera: Conjunctivae normal.     Pupils: Pupils are equal, round, and reactive to light.  Cardiovascular:     Rate and Rhythm: Normal rate and regular rhythm.     Pulses:  Normal pulses.     Heart sounds: Normal heart sounds. No murmur heard.   No friction rub. No gallop.  Pulmonary:     Effort: Pulmonary effort is normal. No respiratory distress.     Breath sounds: No stridor. No wheezing, rhonchi or rales.  Chest:     Chest wall: No tenderness.  Musculoskeletal:        General: Normal range of motion.     Cervical back: Normal range of motion and neck supple. No rigidity or tenderness.  Lymphadenopathy:     Cervical: No cervical adenopathy.  Skin:    General: Skin is warm.  Neurological:     General: No focal deficit present.     Mental Status: He is alert and oriented to person, place, and time.  Psychiatric:        Mood and Affect: Mood normal.        Behavior: Behavior normal.          Assessment & Plan:   1. Flu-like symptoms - Suspect flu - COVID-19, Flu A+B and RSV - May continue taking mucinex, tylenol, robitussin. - May use OTC saline sprays - RTC in 7-10 days or sooner if not better or worsening  2. Excessive ear was in left ear canal - May use OTC debrox for ear relief

## 2021-07-05 LAB — COVID-19, FLU A+B AND RSV
Influenza A, NAA: NOT DETECTED
Influenza B, NAA: NOT DETECTED
RSV, NAA: NOT DETECTED
SARS-CoV-2, NAA: DETECTED — AB

## 2021-07-05 LAB — SPECIMEN STATUS REPORT

## 2021-07-06 NOTE — Progress Notes (Signed)
Hello Samuel Henry,  You test was positive for COVID. Unfortunately, this means you should isolate for 5 days from the onset of symptoms and then wear a mask for an addition 5 days while practicing good hand hygiene. If you cannot isolate in your home, practice good hand hygiene and keep surfaces clean. Most people recover from [place] without issues. However, if you do develop SOB or difficulty breath, please go to the ED. I pray you have a Angela Nevin Christmas despite the news.

## 2021-11-20 ENCOUNTER — Ambulatory Visit: Payer: BC Managed Care – PPO | Admitting: Nurse Practitioner

## 2021-11-20 ENCOUNTER — Encounter: Payer: Self-pay | Admitting: Nurse Practitioner

## 2021-11-20 VITALS — BP 116/66 | HR 66 | Temp 98.2°F | Ht 70.0 in | Wt 201.0 lb

## 2021-11-20 DIAGNOSIS — F411 Generalized anxiety disorder: Secondary | ICD-10-CM | POA: Diagnosis not present

## 2021-11-20 MED ORDER — ALPRAZOLAM 0.5 MG PO TABS
ORAL_TABLET | ORAL | 0 refills | Status: DC
Start: 2021-11-20 — End: 2022-04-14

## 2021-11-20 MED ORDER — DULOXETINE HCL 30 MG PO CPEP: 30.0000 mg | ORAL_CAPSULE | Freq: Every day | ORAL | 0 refills | Status: DC

## 2021-11-20 NOTE — Progress Notes (Signed)
? ?Subjective:  ? ? Patient ID: Samuel Henry, male    DOB: 06/23/1961, 61 y.o.   MRN: [id number] ? ?HPI ? ?61 year old male patient with history of anxiety presents to clinic requesting a refill on xanax. ? ?Patient states that he has tried Zoloft, Lexapro, and Prozac for anxiety.  Patient states that those medications helped while he was taking them however he experienced the side effects of low libido which he did not feel was tolerable.  His Wellbutrin was then increased to 300 mg to try and improve the sexual dysfunction however that was not helpful.  Patient has since stopped taking Zoloft and other SSRIs and has started taking BuSpar 30 mg 3 times a day which has not been helpful. ? ?Patient would like to know what other options does he have. ? ?Patient denies any thoughts of wanting to hurt self or anyone else. ? ?Review of Systems  ?Psychiatric/Behavioral:    ?     Anxiety  ? ?   ?Objective:  ? Physical Exam ?Vitals reviewed.  ?Constitutional:   ?   General: He is not in acute distress. ?   Appearance: Normal appearance. He is normal weight. He is not ill-appearing or toxic-appearing.  ?HENT:  ?   Head: Normocephalic and atraumatic.  ?Cardiovascular:  ?   Rate and Rhythm: Normal rate and regular rhythm.  ?   Pulses: Normal pulses.  ?   Heart sounds: Normal heart sounds. No murmur heard. ?Pulmonary:  ?   Effort: Pulmonary effort is normal. No respiratory distress.  ?   Breath sounds: Normal breath sounds. No wheezing.  ?Musculoskeletal:  ?   Comments: Grossly intact  ?Skin: ?   General: Skin is warm.  ?   Capillary Refill: Capillary refill takes less than 2 seconds.  ?Neurological:  ?   Mental Status: He is alert.  ?   Comments: Grossly intact  ?Psychiatric:     ?   Mood and Affect: Mood normal.     ?   Behavior: Behavior normal.  ? ? ? ?   ?Assessment & Plan:  ? ?1. Generalized anxiety disorder ?-Discussed cognitive behavioral therapy in detail. ?-Therapy offered to patient however patient kindly  declined. ?-Patient encouraged to read the feeling good book by Dr. Judie Petit for bibliotherapy ?-We will start patient on duloxetine 30 mg (SNRI).  Patient to start on 30 mg for 1 week.  If he feels like medication is not working well he may increase to 60 mg after 1 week. ?-We will prescribe only 15 pills of Xanax to act as a bridge until duloxetine starts working in approximately 4 to 6 weeks.  Discussed in detail how provider does not believe that Xanax is a long-term medication and will not be refilling prescription. ?-Patient taking other sedating medications and cautioned on the effects of and side effects of Xanax. ?-Duloxetine side effects discussed in detail. ?- DULoxetine (CYMBALTA) 30 MG capsule; Take 1 capsule (30 mg total) by mouth daily.  Dispense: 120 capsule; Refill: 0 ?- ALPRAZolam (XANAX) 0.5 MG tablet; TAKE 1/2 TO 1 TABLET BY MOUTH AT BEDTIME AS NEEDED  Dispense: 15 tablet; Refill: 0 ?-Serotonin syndrome symptoms discussed.  Patient to notify clinic if he develops any of those symptoms. ?- RTC in 6 weeks for evaluation ? ?  ?Note:  This document was prepared using Dragon voice recognition software and may include unintentional dictation errors. ?Note - This record has been created using [employer].  ?Chart creation errors  have been sought, but may not always  ?have been located. Such creation errors do not reflect on  ?the standard of medical care. ? ? ?

## 2021-12-06 DIAGNOSIS — N200 Calculus of kidney: Secondary | ICD-10-CM | POA: Diagnosis not present

## 2022-01-01 ENCOUNTER — Ambulatory Visit: Payer: Self-pay | Admitting: Nurse Practitioner

## 2022-04-14 ENCOUNTER — Ambulatory Visit: Payer: BC Managed Care – PPO | Admitting: Family Medicine

## 2022-04-14 VITALS — BP 112/64 | HR 67 | Temp 97.8°F | Wt 202.8 lb

## 2022-04-14 DIAGNOSIS — E785 Hyperlipidemia, unspecified: Secondary | ICD-10-CM | POA: Insufficient documentation

## 2022-04-14 DIAGNOSIS — F411 Generalized anxiety disorder: Secondary | ICD-10-CM | POA: Diagnosis not present

## 2022-04-14 DIAGNOSIS — Z0000 Encounter for general adult medical examination without abnormal findings: Secondary | ICD-10-CM

## 2022-04-14 MED ORDER — ALPRAZOLAM 0.5 MG PO TABS
0.5000 mg | ORAL_TABLET | Freq: Two times a day (BID) | ORAL | 3 refills | Status: DC | PRN
Start: 2022-04-14 — End: 2023-03-26

## 2022-04-14 NOTE — Patient Instructions (Signed)
Continue your meds.  Follow up in 6 months.  Take care  Dr. Hulan Szumski  

## 2022-04-14 NOTE — Assessment & Plan Note (Signed)
Stable at this time.  Continue BuSpar and Wellbutrin.  Alprazolam refilled today.

## 2022-04-14 NOTE — Assessment & Plan Note (Signed)
HIV and hep C discontinued from health maintenance section due to patient preference. Patient states that he is going to get his colonoscopy in the near future.

## 2022-04-14 NOTE — Progress Notes (Signed)
Subjective:  Patient ID: Samuel Henry, male    DOB: 1960/09/13  Age: 61 y.o. MRN: [id number]  CC: Chief Complaint  Patient presents with   Anxiety    Pt needing refill on Xanax. Taking Buspar 20 mg in the morning, 20 mg at lunch and 20 mg at night.     HPI:  61 year old male with the below mentioned past medical history presents for follow-up.  Patient states that overall he is doing fairly well.  He suffers from anxiety and insomnia.  Is also had issues with depression.  He is currently doing well on bupropion, BuSpar, and trazodone.  He uses Atarax as needed for breakthrough insomnia.  Patient states that he has been using Xanax for anxiety in addition to the above treatment.  He is in need of refill.  Discussed patient's preventative health care today.  Patient declines HIV and hepatitis C screening.  He is due for colonoscopy.  He states that he is going to schedule.  Patient Active Problem List   Diagnosis Date Noted   Hyperlipidemia 04/14/2022   Preventative health care 04/14/2022   History of migraine headaches 09/03/2013   S/P right knee arthroscopy 03/24/2013   Generalized anxiety disorder 10/08/2012   Insomnia 10/08/2012   GERD (gastroesophageal reflux disease) 10/01/2012    Social Hx   Social History   Socioeconomic History   Marital status: Married    Spouse name: Not on file   Number of children: Not on file   Years of education: Not on file   Highest education level: Not on file  Occupational History   Not on file  Tobacco Use   Smoking status: Never   Smokeless tobacco: Never  Substance and Sexual Activity   Alcohol use: No   Drug use: No   Sexual activity: Yes    Birth control/protection: None  Other Topics Concern   Not on file  Social History Narrative   Not on file   Social Determinants of Health   Financial Resource Strain: Not on file  Food Insecurity: Not on file  Transportation Needs: Not on file  Physical Activity: Not on file   Stress: Not on file  Social Connections: Not on file    Review of Systems  Constitutional: Negative.   Psychiatric/Behavioral:  The patient is nervous/anxious.    Objective:  BP 112/64   Pulse 67   Temp 97.8 F (36.6 C)   Wt 202 lb 12.8 oz (92 kg)   SpO2 96%   BMI 29.10 kg/m      04/14/2022    1:10 PM 11/20/2021    2:46 PM 07/04/2021    2:56 PM  BP/Weight  Systolic BP 818 563 149  Diastolic BP 64 66 74  Wt. (Lbs) 202.8 201 202.4  BMI 29.1 kg/m2 28.84 kg/m2 29.04 kg/m2    Physical Exam Vitals and nursing note reviewed.  Constitutional:      General: He is not in acute distress.    Appearance: Normal appearance.  HENT:     Head: Normocephalic and atraumatic.  Eyes:     General:        Right eye: No discharge.        Left eye: No discharge.     Conjunctiva/sclera: Conjunctivae normal.  Cardiovascular:     Rate and Rhythm: Normal rate and regular rhythm.  Pulmonary:     Effort: Pulmonary effort is normal.     Breath sounds: Normal breath sounds. No wheezing or rales.  Neurological:     Mental Status: He is alert.  Psychiatric:        Mood and Affect: Mood normal.        Behavior: Behavior normal.     Lab Results  Component Value Date   WBC 5.4 03/14/2021   HGB 13.7 03/14/2021   HCT 40.2 03/14/2021   PLT 175 03/14/2021   GLUCOSE 96 03/14/2021   CHOL 162 03/14/2021   TRIG 55 03/14/2021   HDL 40 03/14/2021   LDLCALC 111 (H) 03/14/2021   ALT 16 03/14/2021   AST 20 03/14/2021   NA 139 03/14/2021   K 4.2 03/14/2021   CL 105 03/14/2021   CREATININE 1.05 03/14/2021   BUN 20 03/14/2021   CO2 22 03/14/2021     Assessment & Plan:   Problem List Items Addressed This Visit       Other   Generalized anxiety disorder - Primary    Stable at this time.  Continue BuSpar and Wellbutrin.  Alprazolam refilled today.      Relevant Medications   hydrOXYzine (ATARAX) 10 MG tablet   ALPRAZolam (XANAX) 0.5 MG tablet   Preventative health care    HIV and  hep C discontinued from health maintenance section due to patient preference. Patient states that he is going to get his colonoscopy in the near future.         Meds ordered this encounter  Medications   ALPRAZolam (XANAX) 0.5 MG tablet    Sig: Take 1 tablet (0.5 mg total) by mouth 2 (two) times daily as needed for anxiety.    Dispense:  60 tablet    Refill:  3    Follow-up:  Return in about 6 months (around 10/14/2022).  [place]

## 2022-08-07 DIAGNOSIS — N5201 Erectile dysfunction due to arterial insufficiency: Secondary | ICD-10-CM | POA: Diagnosis not present

## 2022-08-07 DIAGNOSIS — N200 Calculus of kidney: Secondary | ICD-10-CM | POA: Diagnosis not present

## 2022-10-14 ENCOUNTER — Ambulatory Visit: Payer: BC Managed Care – PPO | Admitting: Family Medicine

## 2023-03-05 LAB — LAB REPORT - SCANNED
A1c: 5.1
EGFR: 81

## 2023-03-26 ENCOUNTER — Encounter: Payer: Self-pay | Admitting: Family Medicine

## 2023-03-26 ENCOUNTER — Ambulatory Visit: Payer: BC Managed Care – PPO | Admitting: Family Medicine

## 2023-03-26 VITALS — BP 118/74 | HR 63 | Temp 98.1°F | Wt 203.4 lb

## 2023-03-26 DIAGNOSIS — F411 Generalized anxiety disorder: Secondary | ICD-10-CM

## 2023-03-26 DIAGNOSIS — G8929 Other chronic pain: Secondary | ICD-10-CM

## 2023-03-26 DIAGNOSIS — Z1211 Encounter for screening for malignant neoplasm of colon: Secondary | ICD-10-CM

## 2023-03-26 DIAGNOSIS — M545 Low back pain, unspecified: Secondary | ICD-10-CM | POA: Diagnosis not present

## 2023-03-26 MED ORDER — ALPRAZOLAM 0.5 MG PO TABS: 0.5000 mg | ORAL_TABLET | Freq: Two times a day (BID) | ORAL | 3 refills | Status: DC | PRN

## 2023-03-26 NOTE — Patient Instructions (Addendum)
Xray at the hospital.  Hold the Etodolac for now. Get me results of next blood draw.  Tylenol 1000 mg 3 times daily as needed (for now while awaiting blood result and xray).  Take care  Dr. Adriana Simas

## 2023-03-26 NOTE — Progress Notes (Signed)
Subjective:  Patient ID: Samuel Henry, male    DOB: [birth year]  Age: 62 y.o. MRN: [id number]  CC: Back pain   HPI:  62 year old male presents for evaluation of the above.  Longstanding low back pain (>10 years). Worsening over the past 1-1.5 years.  Bilateral. No radicular symptoms. Uses etodolac occasionally. It was recently prescribed by a provider through is work. He recent had labs on 8/22 through work which revealed a slightly low Hb of 12.9. He was therefore advised to not use Etodolac until repeat CBC was done. Overdue for colonosocopy.  Anxiety currently stable. Requesting refill on Xanax.  Patient Active Problem List   Diagnosis Date Noted   Chronic bilateral low back pain without sciatica 03/26/2023   Hyperlipidemia 04/14/2022   Preventative health care 04/14/2022   History of migraine headaches 09/03/2013   S/P right knee arthroscopy 03/24/2013   Generalized anxiety disorder 10/08/2012   Insomnia 10/08/2012   GERD (gastroesophageal reflux disease) 10/01/2012    Social Hx   Social History   Socioeconomic History   Marital status: Married    Spouse name: Not on file   Number of children: Not on file   Years of education: Not on file   Highest education level: Not on file  Occupational History   Not on file  Tobacco Use   Smoking status: Never   Smokeless tobacco: Never  Substance and Sexual Activity   Alcohol use: No   Drug use: No   Sexual activity: Yes    Birth control/protection: None  Other Topics Concern   Not on file  Social History Narrative   Not on file   Social Determinants of Health   Financial Resource Strain: Not on file  Food Insecurity: Not on file  Transportation Needs: Not on file  Physical Activity: Not on file  Stress: Not on file  Social Connections: Not on file    Review of Systems Per HPI  Objective:  BP 118/74   Pulse 63   Temp 98.1 F (36.7 C)   Wt 203 lb 6.4 oz (92.3 kg)   SpO2 100%   BMI 29.18 kg/m       03/26/2023    2:18 PM 04/14/2022    1:10 PM 11/20/2021    2:46 PM  BP/Weight  Systolic BP 118 112 116  Diastolic BP 74 64 66  Wt. (Lbs) 203.4 202.8 201  BMI 29.18 kg/m2 29.1 kg/m2 28.84 kg/m2    Physical Exam Vitals reviewed.  Constitutional:      General: He is not in acute distress.    Appearance: Normal appearance.  HENT:     Head: Normocephalic and atraumatic.  Cardiovascular:     Rate and Rhythm: Normal rate and regular rhythm.  Pulmonary:     Effort: Pulmonary effort is normal.     Breath sounds: Normal breath sounds. No wheezing or rales.  Musculoskeletal:     Comments: No tenderness of the lumbar spine.  Neurological:     Mental Status: He is alert.     Lab Results  Component Value Date   WBC 5.4 03/14/2021   HGB 13.7 03/14/2021   HCT 40.2 03/14/2021   PLT 175 03/14/2021   GLUCOSE 96 03/14/2021   CHOL 162 03/14/2021   TRIG 55 03/14/2021   HDL 40 03/14/2021   LDLCALC 111 (H) 03/14/2021   ALT 16 03/14/2021   AST 20 03/14/2021   NA 139 03/14/2021   K 4.2 03/14/2021   CL  105 03/14/2021   CREATININE 1.05 03/14/2021   BUN 20 03/14/2021   CO2 22 03/14/2021     Assessment & Plan:   Problem List Items Addressed This Visit       Other   Generalized anxiety disorder    Stable. Xanax refilled.       Relevant Medications   ALPRAZolam (XANAX) 0.5 MG tablet   Chronic bilateral low back pain without sciatica - Primary    Worsening.  Xray for further evaluation. Tylenol as needed.      Relevant Orders   DG Lumbar Spine Complete   Other Visit Diagnoses     Encounter for screening colonoscopy       Relevant Orders   Ambulatory referral to Gastroenterology       Meds ordered this encounter  Medications   ALPRAZolam (XANAX) 0.5 MG tablet    Sig: Take 1 tablet (0.5 mg total) by mouth 2 (two) times daily as needed for anxiety.    Dispense:  60 tablet    Refill:  3    Follow-up:  Pending Xray  Everlene Other DO [facility]

## 2023-03-26 NOTE — Assessment & Plan Note (Signed)
Stable. Xanax refilled.  

## 2023-03-26 NOTE — Assessment & Plan Note (Signed)
Worsening.  Xray for further evaluation. Tylenol as needed.

## 2023-03-27 ENCOUNTER — Ambulatory Visit (HOSPITAL_COMMUNITY)
Admission: RE | Admit: 2023-03-27 | Discharge: 2023-03-27 | Disposition: A | Discharge status: Home / Self Care | Payer: BC Managed Care – PPO | Source: Ambulatory Visit | Attending: Family Medicine | Admitting: Family Medicine

## 2023-03-27 DIAGNOSIS — M4316 Spondylolisthesis, lumbar region: Secondary | ICD-10-CM | POA: Diagnosis not present

## 2023-03-27 DIAGNOSIS — M5136 Other intervertebral disc degeneration, lumbar region: Secondary | ICD-10-CM | POA: Diagnosis not present

## 2023-03-27 DIAGNOSIS — M545 Low back pain, unspecified: Secondary | ICD-10-CM | POA: Insufficient documentation

## 2023-03-27 DIAGNOSIS — G8929 Other chronic pain: Secondary | ICD-10-CM | POA: Insufficient documentation

## 2023-03-27 DIAGNOSIS — M419 Scoliosis, unspecified: Secondary | ICD-10-CM | POA: Diagnosis not present

## 2023-04-01 ENCOUNTER — Encounter: Payer: Self-pay | Admitting: *Deleted

## 2023-04-16 ENCOUNTER — Other Ambulatory Visit: Payer: Self-pay

## 2023-04-16 DIAGNOSIS — M545 Low back pain, unspecified: Secondary | ICD-10-CM

## 2023-04-24 DIAGNOSIS — M4696 Unspecified inflammatory spondylopathy, lumbar region: Secondary | ICD-10-CM | POA: Diagnosis not present

## 2023-10-01 ENCOUNTER — Encounter: Payer: Self-pay | Admitting: *Deleted

## 2023-11-28 LAB — LAB REPORT - SCANNED
EGFR (Non-African Amer.): 70
PSA, Total: 3.4
TSH: 1.78 (ref 0.41–5.90)

## 2023-12-28 ENCOUNTER — Encounter: Payer: Self-pay | Admitting: Nurse Practitioner

## 2024-04-26 ENCOUNTER — Telehealth: Payer: Self-pay | Admitting: *Deleted

## 2024-04-26 MED ORDER — NA SULFATE-K SULFATE-MG SULF 17.5-3.13-1.6 GM/177ML PO SOLN: 1.0000 | ORAL | 0 refills | Status: AC

## 2024-04-26 NOTE — Telephone Encounter (Signed)
 Per Dr. Shaaron Patient overdue for colonoscopy by 5 years due to a large adenoma removed 2017.Needs to be scheduled for surveillance colonoscopy.  Also worsening GERD and dysphagia needs a double.I do not think we need to see him in the office. I would like to have a triage to sign off on.  Looks like an ASA 2.  Lets find a spot on a Monday.  Colonoscopy-history of polyps and EGD with possible esophageal dilation esophageal dysphagia.  Thanks.  Anytime between now and the end of this calendar year.  Called pt, LMOVM

## 2024-04-26 NOTE — Telephone Encounter (Addendum)
 Spoke with pt. He has been scheduled for 11/10. He is aware will mail instructions to him. He requested anything other than trylite. Suprep sent in.  Checked carelon and no PA required

## 2024-04-26 NOTE — Telephone Encounter (Signed)
 Do you have family history of colon cancer?  NO  Do you have a family history of polyps? NO  Are you diabetic?  NO  Do you have a prosthetic or mechanical heart valve? NO  Do you have a pacemaker/defibrillator?   NO  Have you had endocarditis/atrial fibrillation?  NO  Do you use supplemental oxygen/CPAP?  NO  Have you had joint replacement within the last 12 months?  NO  Do you tend to be constipated or have to use laxatives?  NO   Do you have history of alcohol use? If yes, how much and how often.  NO  Do you have history or are you using drugs? If yes, what do are you  using?  NO  Have you ever had a stroke/heart attack?  NO  Have you ever had a heart or other vascular stent placed,? NO  Do you take weight loss medication? NO  Do you take any blood-thinning medications such as: (Plavix, aspirin, Coumadin, Aggrenox, Brilinta, Xarelto, Eliquis, Pradaxa, Savaysa or Effient)? NO  If yes we need the name, milligram, dosage and who is prescribing doctor:               Current Outpatient Medications on File Prior to Visit  Medication Sig Dispense Refill   ALPRAZolam  (XANAX ) 0.5 MG tablet Take 1 tablet (0.5 mg total) by mouth 2 (two) times daily as needed for anxiety. 60 tablet 3   buPROPion  (WELLBUTRIN  XL) 300 MG 24 hr tablet Take 300 mg by mouth daily.     busPIRone (BUSPAR) 30 MG tablet 1 tablet Orally Twice a day for 90 days (Patient taking differently: Take 60 mg by mouth daily.)     cetirizine (ZYRTEC) 10 MG tablet Take 10 mg by mouth. As needed spring/fall     etodolac (LODINE) 400 MG tablet Take 400 mg by mouth as needed.     omeprazole  (PRILOSEC) 20 MG capsule Take 1 capsule (20 mg total) by mouth daily. 90 capsule 3   traZODone (DESYREL) 100 MG tablet Take 150 mg by mouth at bedtime.     No current facility-administered medications on file prior to visit.    Allergies  Allergen Reactions   Nsaids Other (See Comments)    REACTION: flares reflux up

## 2024-04-26 NOTE — Addendum Note (Signed)
 Addended by: JEANELL GRAEME RAMAN on: 04/26/2024 04:21 PM   Modules accepted: Orders

## 2024-05-23 ENCOUNTER — Ambulatory Visit (HOSPITAL_COMMUNITY)
Admission: RE | Admit: 2024-05-23 | Discharge: 2024-05-23 | Disposition: A | Discharge status: Home / Self Care | Attending: Internal Medicine | Admitting: Internal Medicine

## 2024-05-23 ENCOUNTER — Ambulatory Visit (HOSPITAL_COMMUNITY): Admitting: Anesthesiology

## 2024-05-23 ENCOUNTER — Encounter (HOSPITAL_COMMUNITY): Payer: Self-pay | Admitting: Internal Medicine

## 2024-05-23 ENCOUNTER — Encounter (HOSPITAL_COMMUNITY)
Admission: RE | Disposition: A | Discharge status: Home / Self Care | Payer: Self-pay | Source: Home / Self Care | Attending: Internal Medicine

## 2024-05-23 ENCOUNTER — Other Ambulatory Visit: Payer: Self-pay

## 2024-05-23 DIAGNOSIS — K635 Polyp of colon: Secondary | ICD-10-CM | POA: Diagnosis not present

## 2024-05-23 DIAGNOSIS — R1310 Dysphagia, unspecified: Secondary | ICD-10-CM | POA: Diagnosis not present

## 2024-05-23 DIAGNOSIS — D120 Benign neoplasm of cecum: Secondary | ICD-10-CM | POA: Diagnosis not present

## 2024-05-23 DIAGNOSIS — R1314 Dysphagia, pharyngoesophageal phase: Secondary | ICD-10-CM | POA: Insufficient documentation

## 2024-05-23 DIAGNOSIS — K219 Gastro-esophageal reflux disease without esophagitis: Secondary | ICD-10-CM | POA: Insufficient documentation

## 2024-05-23 DIAGNOSIS — K317 Polyp of stomach and duodenum: Secondary | ICD-10-CM | POA: Diagnosis not present

## 2024-05-23 DIAGNOSIS — Z79899 Other long term (current) drug therapy: Secondary | ICD-10-CM | POA: Diagnosis not present

## 2024-05-23 DIAGNOSIS — F32A Depression, unspecified: Secondary | ICD-10-CM | POA: Diagnosis not present

## 2024-05-23 DIAGNOSIS — Z1211 Encounter for screening for malignant neoplasm of colon: Secondary | ICD-10-CM | POA: Diagnosis present

## 2024-05-23 DIAGNOSIS — F419 Anxiety disorder, unspecified: Secondary | ICD-10-CM | POA: Diagnosis not present

## 2024-05-23 DIAGNOSIS — D123 Benign neoplasm of transverse colon: Secondary | ICD-10-CM

## 2024-05-23 DIAGNOSIS — Z860100 Personal history of colon polyps, unspecified: Secondary | ICD-10-CM

## 2024-05-23 DIAGNOSIS — I10 Essential (primary) hypertension: Secondary | ICD-10-CM | POA: Insufficient documentation

## 2024-05-23 HISTORY — PX: ESOPHAGOGASTRODUODENOSCOPY: SHX5428

## 2024-05-23 HISTORY — PX: ESOPHAGEAL DILATION: SHX303

## 2024-05-23 HISTORY — PX: COLONOSCOPY: SHX5424

## 2024-05-23 SURGERY — COLONOSCOPY
Anesthesia: Monitor Anesthesia Care

## 2024-05-23 MED ORDER — DEXMEDETOMIDINE HCL IN NACL 80 MCG/20ML IV SOLN
INTRAVENOUS | Status: DC | PRN
  Administered 2024-05-23: 6 ug via INTRAVENOUS
  Administered 2024-05-23: 2 ug via INTRAVENOUS

## 2024-05-23 MED ORDER — GLYCOPYRROLATE PF 0.2 MG/ML IJ SOSY
PREFILLED_SYRINGE | INTRAMUSCULAR | Status: DC | PRN
  Administered 2024-05-23 (×2): .1 mg via INTRAVENOUS

## 2024-05-23 MED ORDER — EPHEDRINE SULFATE (PRESSORS) 25 MG/5ML IV SOSY
PREFILLED_SYRINGE | INTRAVENOUS | Status: DC | PRN
  Administered 2024-05-23 (×2): 10 mg via INTRAVENOUS

## 2024-05-23 MED ORDER — PHENYLEPHRINE 80 MCG/ML (10ML) SYRINGE FOR IV PUSH (FOR BLOOD PRESSURE SUPPORT)
PREFILLED_SYRINGE | INTRAVENOUS | Status: DC | PRN
  Administered 2024-05-23: 80 ug via INTRAVENOUS
  Administered 2024-05-23: 240 ug via INTRAVENOUS
  Administered 2024-05-23: 80 ug via INTRAVENOUS
  Administered 2024-05-23 (×2): 160 ug via INTRAVENOUS

## 2024-05-23 MED ORDER — LIDOCAINE 2% (20 MG/ML) 5 ML SYRINGE
INTRAMUSCULAR | Status: DC | PRN
Start: 2024-05-23 — End: 2024-05-23
  Administered 2024-05-23: 100 mg via INTRAVENOUS

## 2024-05-23 MED ORDER — LACTATED RINGERS IV SOLN: INTRAVENOUS | Status: DC

## 2024-05-23 MED ORDER — PROPOFOL 10 MG/ML IV BOLUS
INTRAVENOUS | Status: DC | PRN
  Administered 2024-05-23: 100 mg via INTRAVENOUS
  Administered 2024-05-23 (×2): 20 mg via INTRAVENOUS
  Administered 2024-05-23: 125 ug/kg/min via INTRAVENOUS
  Administered 2024-05-23: 40 mg via INTRAVENOUS
  Administered 2024-05-23: 30 mg via INTRAVENOUS

## 2024-05-23 NOTE — Transfer of Care (Signed)
 Immediate Anesthesia Transfer of Care Note  Patient: Samuel Henry  Procedure(s) Performed: COLONOSCOPY EGD (ESOPHAGOGASTRODUODENOSCOPY) DILATION, ESOPHAGUS  Patient Location: Endoscopy Unit  Anesthesia Type:General  Level of Consciousness: drowsy and patient cooperative  Airway & Oxygen Therapy: Patient Spontanous Breathing  Post-op Assessment: Report given to RN and Post -op Vital signs reviewed and stable  Post vital signs: Reviewed and stable  Last Vitals:  Vitals Value Taken Time  BP 117/57 05/23/24 10:30  Temp 36.4 C 05/23/24 10:30  Pulse 54 05/23/24 10:30  Resp 15 05/23/24 10:30  SpO2 97 % 05/23/24 10:30    Last Pain:  Vitals:   05/23/24 1030  TempSrc: Oral  PainSc:       Patients Stated Pain Goal: 5 (05/23/24 9191)  Complications: No notable events documented.

## 2024-05-23 NOTE — Anesthesia Preprocedure Evaluation (Signed)
 Anesthesia Evaluation  Patient identified by MRN, date of birth, ID band Patient awake    Reviewed: Allergy & Precautions, H&P , NPO status , Patient's Chart, lab work & pertinent test results, reviewed documented beta blocker date and time   Airway Mallampati: II  TM Distance: >3 FB Neck ROM: full    Dental no notable dental hx.    Pulmonary neg pulmonary ROS   Pulmonary exam normal breath sounds clear to auscultation       Cardiovascular Exercise Tolerance: Good hypertension, negative cardio ROS  Rhythm:regular Rate:Normal     Neuro/Psych  PSYCHIATRIC DISORDERS Anxiety Depression    negative neurological ROS     GI/Hepatic Neg liver ROS,GERD  ,,  Endo/Other  negative endocrine ROS    Renal/GU negative Renal ROS  negative genitourinary   Musculoskeletal   Abdominal   Peds  Hematology negative hematology ROS (+)   Anesthesia Other Findings   Reproductive/Obstetrics negative OB ROS                              Anesthesia Physical Anesthesia Plan  ASA: 2  Anesthesia Plan: MAC   Post-op Pain Management:    Induction:   PONV Risk Score and Plan:   Airway Management Planned:   Additional Equipment:   Intra-op Plan:   Post-operative Plan:   Informed Consent: I have reviewed the patients History and Physical, chart, labs and discussed the procedure including the risks, benefits and alternatives for the proposed anesthesia with the patient or authorized representative who has indicated his/her understanding and acceptance.     Dental Advisory Given  Plan Discussed with: CRNA  Anesthesia Plan Comments:         Anesthesia Quick Evaluation

## 2024-05-23 NOTE — Discharge Instructions (Signed)
 EGD Discharge instructions Please read the instructions outlined below and refer to this sheet in the next few weeks. These discharge instructions provide you with general information on caring for yourself after you leave the hospital. Your doctor may also give you specific instructions. While your treatment has been planned according to the most current medical practices available, unavoidable complications occasionally occur. If you have any problems or questions after discharge, please call your doctor. ACTIVITY You may resume your regular activity but move at a slower pace for the next 24 hours.  Take frequent rest periods for the next 24 hours.  Walking will help expel (get rid of) the air and reduce the bloated feeling in your abdomen.  No driving for 24 hours (because of the anesthesia (medicine) used during the test).  You may shower.  Do not sign any important legal documents or operate any machinery for 24 hours (because of the anesthesia used during the test).  NUTRITION Drink plenty of fluids.  You may resume your normal diet.  Begin with a light meal and progress to your normal diet.  Avoid alcoholic beverages for 24 hours or as instructed by your caregiver.  MEDICATIONS You may resume your normal medications unless your caregiver tells you otherwise.  WHAT YOU CAN EXPECT TODAY You may experience abdominal discomfort such as a feeling of fullness or "gas" pains.  FOLLOW-UP Your doctor will discuss the results of your test with you.  SEEK IMMEDIATE MEDICAL ATTENTION IF ANY OF THE FOLLOWING OCCUR: Excessive nausea (feeling sick to your stomach) and/or vomiting.  Severe abdominal pain and distention (swelling).  Trouble swallowing.  Temperature over 101 F (37.8 C).  Rectal bleeding or vomiting of blood.    Colonoscopy Discharge Instructions  Read the instructions outlined below and refer to this sheet in the next few weeks. These discharge instructions provide you with  general information on caring for yourself after you leave the hospital. Your doctor may also give you specific instructions. While your treatment has been planned according to the most current medical practices available, unavoidable complications occasionally occur. If you have any problems or questions after discharge, call Dr. Shaaron at [phone]. ACTIVITY You may resume your regular activity, but move at a slower pace for the next 24 hours.  Take frequent rest periods for the next 24 hours.  Walking will help get rid of the air and reduce the bloated feeling in your belly (abdomen).  No driving for 24 hours (because of the medicine (anesthesia) used during the test).   Do not sign any important legal documents or operate any machinery for 24 hours (because of the anesthesia used during the test).  NUTRITION Drink plenty of fluids.  You may resume your normal diet as instructed by your doctor.  Begin with a light meal and progress to your normal diet. Heavy or fried foods are harder to digest and may make you feel sick to your stomach (nauseated).  Avoid alcoholic beverages for 24 hours or as instructed.  MEDICATIONS You may resume your normal medications unless your doctor tells you otherwise.  WHAT YOU CAN EXPECT TODAY Some feelings of bloating in the abdomen.  Passage of more gas than usual.  Spotting of blood in your stool or on the toilet paper.  IF YOU HAD POLYPS REMOVED DURING THE COLONOSCOPY: No aspirin products for 7 days or as instructed.  No alcohol for 7 days or as instructed.  Eat a soft diet for the next 24 hours.  FINDING  OUT THE RESULTS OF YOUR TEST Not all test results are available during your visit. If your test results are not back during the visit, make an appointment with your caregiver to find out the results. Do not assume everything is normal if you have not heard from your caregiver or the medical facility. It is important for you to follow up on all of your test  results.  SEEK IMMEDIATE MEDICAL ATTENTION IF: You have more than a spotting of blood in your stool.  Your belly is swollen (abdominal distention).  You are nauseated or vomiting.  You have a temperature over 101.  You have abdominal pain or discomfort that is severe or gets worse throughout the day.      Your esophagus was stretched and dilated today.  1 polyp removed from your stomach  Stop Prilosec; begin Protonix 40 mg once daily 30 minutes before breakfast.  New prescription has been called in to your pharmacy from my office.    8 polyps found and removed from your colon   further recommendations to follow pending review of pathology report   office visit with us  in 6 weeks.

## 2024-05-23 NOTE — H&P (Signed)
 @LOGO @   Gastroenterology Progress Note    Primary Care Physician:  Cook, Jayce G, DO Primary Gastroenterologist:  Dr. Shaaron  Pre-Procedure History & Physical: HPI:  Samuel Henry is a 63 y.o. male here for   Further evaluation of esophageal dysphagia poorly controlled GERD on Prilosec 20 mg daily also advanced adenoma by size removed 2017;  overdue for surveillance examination (colonoscopy).  Past Medical History:  Diagnosis Date   Acid reflux    Depression    History of kidney stones     Past Surgical History:  Procedure Laterality Date   CHONDROPLASTY Right 03/21/2013   Procedure: MEDIAL AND PATELLAR CHONDROPLASTY;  Surgeon: Taft FORBES Minerva, MD;  Location: [facility];  Service: Orthopedics;  Laterality: Right;   COLONOSCOPY N/A 01/30/2016   Procedure: COLONOSCOPY;  Surgeon: Lamar CHRISTELLA Shaaron, MD;  Location: [facility];  Service: Endoscopy;  Laterality: N/A;  2:00 PM - pt can NOT move up   ESOPHAGEAL DILATION N/A 01/30/2016   Procedure: ESOPHAGEAL DILATION;  Surgeon: Lamar CHRISTELLA Shaaron, MD;  Location: [facility];  Service: Endoscopy;  Laterality: N/A;   ESOPHAGOGASTRODUODENOSCOPY N/A 01/30/2016   Procedure: ESOPHAGOGASTRODUODENOSCOPY (EGD);  Surgeon: Lamar CHRISTELLA Shaaron, MD;  Location: [facility];  Service: Endoscopy;  Laterality: N/A;   KNEE ARTHROSCOPY WITH MEDIAL MENISECTOMY Right 03/21/2013   Procedure: KNEE ARTHROSCOPY WITH MEDIAL MENISECTOMY;  Surgeon: Taft FORBES Minerva, MD;  Location: [facility];  Service: Orthopedics;  Laterality: Right;   KNEE SURGERY Right 2010    Prior to Admission medications   Medication Sig Start Date End Date Taking? Authorizing Provider  ALPRAZolam  (XANAX ) 0.5 MG tablet Take 1 tablet (0.5 mg total) by mouth 2 (two) times daily as needed for anxiety. 03/26/23  Yes Cook, Jayce G, DO  buPROPion  (WELLBUTRIN  XL) 300 MG 24 hr tablet Take 300 mg by mouth daily.   Yes [provider]  busPIRone (BUSPAR) 30 MG tablet 1 tablet Orally Twice a day for 90  days Patient taking differently: Take 60 mg by mouth daily.   Yes [provider]  cetirizine (ZYRTEC) 10 MG tablet Take 10 mg by mouth. As needed spring/fall 10/25/12  Yes [provider]  etodolac (LODINE) 400 MG tablet Take 400 mg by mouth as needed.   Yes [provider]  Na Sulfate-K Sulfate-Mg Sulfate concentrate (SUPREP) 17.5-3.13-1.6 GM/177ML SOLN Take 1 kit (354 mLs total) by mouth as directed. 04/26/24  Yes Damen Windsor, Lamar CHRISTELLA, MD  omeprazole  (PRILOSEC) 20 MG capsule Take 1 capsule (20 mg total) by mouth daily. 08/17/20  Yes Waddell, Malena M, DO  traZODone (DESYREL) 100 MG tablet Take 150 mg by mouth at bedtime.   Yes [provider]    Allergies as of 04/26/2024 - Review Complete 04/26/2024  Allergen Reaction Noted   Nsaids Other (See Comments) 05/28/2010    Family History  Problem Relation Age of Onset   Hypertension Mother    Hypertension Father    Heart attack Father     Social History   Socioeconomic History   Marital status: Married    Spouse name: Not on file   Number of children: Not on file   Years of education: Not on file   Highest education level: Not on file  Occupational History   Not on file  Tobacco Use   Smoking status: Never   Smokeless tobacco: Never  Vaping Use   Vaping status: Never Used  Substance and Sexual Activity   Alcohol use: No  Drug use: No   Sexual activity: Yes    Birth control/protection: None  Other Topics Concern   Not on file  Social History Narrative   Not on file   Social Drivers of Health   Financial Resource Strain: Not on file  Food Insecurity: Not on file  Transportation Needs: Not on file  Physical Activity: Not on file  Stress: Not on file  Social Connections: Not on file  Intimate Partner Violence: Not on file    Review of Systems   See HPI, otherwise negative ROS  Physical Exam: BP 131/87   Pulse 65   Temp 98.3 F (36.8 C) (Oral)   Resp 19   Ht 5' 11 (1.803 m)    Wt 94.3 kg   SpO2 99%   BMI 29.01 kg/m  General:   Alert,  Well-developed, well-nourished, pleasant and cooperative in NAD Skin:  Intact without significant lesions or rashes. Eyes:  Sclera clear, no icterus.   Conjunctiva pink. Ears:  Normal auditory acuity. Nose:  No deformity, discharge,  or lesions. Mouth:  No deformity or lesions. Neck:  Supple; no masses or thyromegaly. No significant cervical adenopathy. Lungs:  Clear throughout to auscultation.   No wheezes, crackles, or rhonchi. No acute distress. Heart:  Regular rate and rhythm; no murmurs, clicks, rubs,  or gallops. Abdomen: Non-distended, normal bowel sounds.  Soft and nontender without appreciable mass or hepatosplenomegaly.  Pulses:  Normal pulses noted. Extremities:  Without clubbing or edema.   Impression/Plan:    63 year old gentleman with advanced adenoma by size 2017 overdue for surveillance.  Also poorly controlled GERD and intermittent esophageal dysphagia here for EGD with esophageal dilation is feasible/appropriate  as well as a surveillance colonoscopy.The risks, benefits, limitations, imponderables and alternatives regarding both EGD and colonoscopy have been reviewed with the patient. Questions have been answered. All parties agreeable.      Notice: This dictation was prepared with Dragon dictation along with smaller phrase technology. Any transcriptional errors that result from this process are unintentional and may not be corrected upon review.

## 2024-05-23 NOTE — Op Note (Signed)
 [facility] Patient Name: Samuel Henry Procedure Date: 05/23/2024 9:10 AM MRN: [id number] Date of Birth: 08-30-1960 Attending MD: Lamar Ozell Hollingshead , MD, [id number] CSN: [id number] Age: 63 Admit Type: Outpatient Procedure:                Upper GI endoscopy Indications:              Dysphagia Providers:                Lamar Ozell Hollingshead, MD, Harlene Lips, Bascom Blush Referring MD:              Medicines:                Propofol  per Anesthesia Complications:            No immediate complications. Estimated Blood Loss:     Estimated blood loss was minimal. Procedure:                Pre-Anesthesia Assessment:                           - Prior to the procedure, a History and Physical                            was performed, and patient medications and                            allergies were reviewed. The patient's tolerance of                            previous anesthesia was also reviewed. The risks                            and benefits of the procedure and the sedation                            options and risks were discussed with the patient.                            All questions were answered, and informed consent                            was obtained. Prior Anticoagulants: The patient has                            taken no anticoagulant or antiplatelet agents. ASA                            Grade Assessment: II - A patient with mild systemic                            disease. After reviewing the risks and benefits,  the patient was deemed in satisfactory condition to                            undergo the procedure.                           After obtaining informed consent, the endoscope was                            passed under direct vision. Throughout the                            procedure, the patient's blood pressure, pulse, and                            oxygen saturations were monitored  continuously. The                            [id number]-YV809 ([id number]) Upper was introduced through                            the mouth, and advanced to the second part of                            duodenum. The upper GI endoscopy was accomplished                            without difficulty. Scope In: 9:36:42 AM Scope Out: 9:52:15 AM Total Procedure Duration: 0 hours 15 minutes 33 seconds  Findings:      The examined esophagus was normal. The scope was withdrawn. Dilation was       performed with a Maloney dilator with mild resistance at 56 Fr. The       dilation site was examined following endoscope reinsertion and showed no       change. Estimated blood loss: none.      Numerous fundic gland appearing polyps present in the body and fundus.       Largest approxi-1 cm. All pedunculated. No ulcer or infiltrating process       seen. Patent pylorus.      The duodenal bulb and second portion of the duodenum were normal.       Largest fundic land polyp was removed with hot snare. 07/16/1958 clip       placed at the polypectomy site to maintain hemostasis. Polyp recovered       with the rescue net. Finally, biopsies of the mid and distal esophagus       taken for histologic study. Impression:               - Normal esophagus. Dilated and biopsied.                           - Fundic gland polyps?"1 removed with hot snare and                            clipped  Normal duodenal bulb and second portion of the                            duodenum.                           - Moderate Sedation:      Moderate (conscious) sedation was personally administered by an       anesthesia professional. The following parameters were monitored: oxygen       saturation, heart rate, blood pressure, respiratory rate, EKG, adequacy       of pulmonary ventilation, and response to care. Recommendation:           - Patient has a contact number available for                             emergencies. The signs and symptoms of potential                            delayed complications were discussed with the                            patient. Return to normal activities tomorrow.                            Written discharge instructions were provided to the                            patient.                           - Advance diet as tolerated. Stop Prilosec. Begin                            Protonix 40 mg once daily. Follow-up on pathology.                            Office visit with us  in 6 weeks. See colonoscopy                            report. Procedure Code(s):        --- Professional ---                           [phone], Esophagogastroduodenoscopy, flexible,                            transoral; diagnostic, including collection of                            specimen(s) by brushing or washing, when performed                            (separate procedure)                           43450, Dilation of  esophagus, by unguided sound or                            bougie, single or multiple passes Diagnosis Code(s):        --- Professional ---                           R13.10, Dysphagia, unspecified CPT copyright 2022 American Medical Association. All rights reserved. The codes documented in this report are preliminary and upon coder review may  be revised to meet current compliance requirements. Lamar HERO. Itamar Mcgowan, MD Lamar Ozell Hollingshead, MD 05/23/2024 10:24:55 AM This report has been signed electronically. Number of Addenda: 0

## 2024-05-23 NOTE — Anesthesia Procedure Notes (Signed)
 Date/Time: 05/23/2024 9:33 AM  Performed by: Para Jerelene CROME, CRNAOxygen Delivery Method: Nasal cannula Comments: OptiFlow Nasal Cannula.

## 2024-05-23 NOTE — Op Note (Signed)
 [facility] Patient Name: Samuel Henry Procedure Date: 05/23/2024 9:05 AM MRN: [id number] Date of Birth: 14-Jun-1961 Attending MD: Lamar Ozell Hollingshead , MD, [id number] CSN: [id number] Age: 63 Admit Type: Outpatient Procedure:                Colonoscopy Indications:              High risk colon cancer surveillance: Personal                            history of colonic polyps Providers:                Lamar Ozell Hollingshead, MD, Harlene Lips, Bascom Blush Referring MD:              Medicines:                Propofol  per Anesthesia Complications:            No immediate complications. Estimated Blood Loss:     Estimated blood loss was minimal. Procedure:                Pre-Anesthesia Assessment:                           - Prior to the procedure, a History and Physical                            was performed, and patient medications and                            allergies were reviewed. The patient's tolerance of                            previous anesthesia was also reviewed. The risks                            and benefits of the procedure and the sedation                            options and risks were discussed with the patient.                            All questions were answered, and informed consent                            was obtained. Prior Anticoagulants: The patient has                            taken no anticoagulant or antiplatelet agents. ASA                            Grade Assessment: II - A patient with mild systemic  disease. After reviewing the risks and benefits,                            the patient was deemed in satisfactory condition to                            undergo the procedure.                           After obtaining informed consent, the colonoscope                            was passed under direct vision. Throughout the                            procedure, the patient's blood  pressure, pulse, and                            oxygen saturations were monitored continuously. The                            CF-HQ190L ([id number]) Colon was introduced through                            the anus and advanced to the the cecum, identified                            by appendiceal orifice and ileocecal valve. The                            colonoscopy was performed without difficulty. The                            patient tolerated the procedure well. The quality                            of the bowel preparation was adequate. The                            ileocecal valve, appendiceal orifice, and rectum                            were photographed. Scope In: 9:58:49 AM Scope Out: 10:22:17 AM Scope Withdrawal Time: 0 hours 18 minutes 38 seconds  Total Procedure Duration: 0 hours 23 minutes 28 seconds  Findings:      The perianal and digital rectal examinations were normal.      Seven sessile polyps were found in the sigmoid colon, splenic flexure       and cecum. The polyps were 6 to 10 mm in size. These polyps were removed       with a hot snare. Resection and retrieval were complete. Estimated blood       loss was minimal.      A 5 mm polyp was found in the sigmoid colon. The polyp was sessile. The  polyp was removed with a cold snare. Resection and retrieval were       complete. Estimated blood loss was minimal.      The exam was otherwise without abnormality on direct and retroflexion       views. Impression:               - Seven 6 to 10 mm polyps in the sigmoid colon, at                            the splenic flexure and in the cecum, removed with                            a hot snare. Resected and retrieved.                           - One 5 mm polyp in the sigmoid colon, removed with                            a cold snare. Resected and retrieved.                           - The examination was otherwise normal on direct                            and  retroflexion views. Moderate Sedation:      Moderate (conscious) sedation was personally administered by an       anesthesia professional. The following parameters were monitored: oxygen       saturation, heart rate, blood pressure, respiratory rate, EKG, adequacy       of pulmonary ventilation, and response to care. Recommendation:           - Patient has a contact number available for                            emergencies. The signs and symptoms of potential                            delayed complications were discussed with the                            patient. Return to normal activities tomorrow.                            Written discharge instructions were provided to the                            patient.                           - Advance diet as tolerated.                           - Continue present medications.                           -  Repeat colonoscopy date to be determined after                            pending pathology results are reviewed for                            surveillance.                           - Return to GI office (date not yet determined).                            See EGD report. Procedure Code(s):        --- Professional ---                           [phone], Colonoscopy, flexible; with removal of                            tumor(s), polyp(s), or other lesion(s) by snare                            technique Diagnosis Code(s):        --- Professional ---                           Z86.010, Personal history of colonic polyps                           D12.5, Benign neoplasm of sigmoid colon                           D12.3, Benign neoplasm of transverse colon (hepatic                            flexure or splenic flexure)                           D12.0, Benign neoplasm of cecum CPT copyright 2022 American Medical Association. All rights reserved. The codes documented in this report are preliminary and upon coder review may  be revised to meet  current compliance requirements. Lamar HERO. Jakala Herford, MD Lamar Ozell Hollingshead, MD 05/23/2024 89:71:77 AM This report has been signed electronically. Number of Addenda: 0

## 2024-05-24 ENCOUNTER — Encounter (HOSPITAL_COMMUNITY): Payer: Self-pay | Admitting: Internal Medicine

## 2024-05-24 ENCOUNTER — Ambulatory Visit: Payer: Self-pay | Admitting: Internal Medicine

## 2024-05-24 LAB — SURGICAL PATHOLOGY

## 2024-05-24 NOTE — Anesthesia Postprocedure Evaluation (Signed)
 Anesthesia Post Note  Patient: Samuel Henry  Procedure(s) Performed: COLONOSCOPY EGD (ESOPHAGOGASTRODUODENOSCOPY) DILATION, ESOPHAGUS  Patient location during evaluation: Phase II Anesthesia Type: MAC Level of consciousness: awake Pain management: pain level controlled Vital Signs Assessment: post-procedure vital signs reviewed and stable Respiratory status: spontaneous breathing and respiratory function stable Cardiovascular status: blood pressure returned to baseline and stable Postop Assessment: no headache and no apparent nausea or vomiting Anesthetic complications: no Comments: Late entry   No notable events documented.   Last Vitals:  Vitals:   05/23/24 1030 05/23/24 1035  BP: (!) 117/57 (!) 92/43  Pulse: (!) 54 67  Resp: 15 14  Temp: 36.4 C   SpO2: 97% 97%    Last Pain:  Vitals:   05/23/24 1037  TempSrc:   PainSc: 0-No pain                 Yvonna JINNY Bosworth

## 2024-06-23 ENCOUNTER — Ambulatory Visit: Admitting: Family Medicine

## 2024-06-23 VITALS — BP 114/76 | HR 68 | Temp 98.1°F | Ht 71.0 in | Wt 214.1 lb

## 2024-06-23 DIAGNOSIS — F411 Generalized anxiety disorder: Secondary | ICD-10-CM

## 2024-06-23 DIAGNOSIS — K219 Gastro-esophageal reflux disease without esophagitis: Secondary | ICD-10-CM

## 2024-06-23 DIAGNOSIS — F5101 Primary insomnia: Secondary | ICD-10-CM | POA: Diagnosis not present

## 2024-06-23 MED ORDER — ALPRAZOLAM 0.5 MG PO TABS: 0.5000 mg | ORAL_TABLET | Freq: Two times a day (BID) | ORAL | 3 refills | Status: AC | PRN

## 2024-06-23 MED ORDER — OMEPRAZOLE 20 MG PO CPDR: 20.0000 mg | DELAYED_RELEASE_CAPSULE | Freq: Every day | ORAL | 3 refills | Status: DC

## 2024-06-23 NOTE — Assessment & Plan Note (Signed)
Stable.  Trazodone refilled. 

## 2024-06-23 NOTE — Patient Instructions (Signed)
 Meds sent in.  Follow up in 6 months.  Take care  Dr. Bluford

## 2024-06-23 NOTE — Assessment & Plan Note (Signed)
Stable.  Meds refilled. 

## 2024-06-23 NOTE — Progress Notes (Signed)
 Subjective:  Patient ID: Samuel Henry, male    DOB: 11/04/1960  Age: 63 y.o. MRN: [id number]  CC:  Follow up   HPI:  63 year old male presents for follow up.  Declines flu shot today.   Anxiety stable. Needs medications refilled.  GERD stable on Omeprazole .  Insomnia stable on Trazodone.  Patient Active Problem List   Diagnosis Date Noted   Chronic bilateral low back pain without sciatica 03/26/2023   Hyperlipidemia 04/14/2022   Preventative health care 04/14/2022   History of migraine headaches 09/03/2013   S/P right knee arthroscopy 03/24/2013   Generalized anxiety disorder 10/08/2012   Insomnia 10/08/2012   GERD (gastroesophageal reflux disease) 10/01/2012    Social Hx   Social History   Socioeconomic History   Marital status: Married    Spouse name: Not on file   Number of children: Not on file   Years of education: Not on file   Highest education level: 12th grade  Occupational History   Not on file  Tobacco Use   Smoking status: Never   Smokeless tobacco: Never  Vaping Use   Vaping status: Never Used  Substance and Sexual Activity   Alcohol use: No   Drug use: No   Sexual activity: Yes    Birth control/protection: None  Other Topics Concern   Not on file  Social History Narrative   Not on file   Social Drivers of Health   Tobacco Use: Low Risk (05/23/2024)   Patient History    Smoking Tobacco Use: Never    Smokeless Tobacco Use: Never    Passive Exposure: Not on file  Financial Resource Strain: Low Risk (06/22/2024)   Overall Financial Resource Strain (CARDIA)    Difficulty of Paying Living Expenses: Not hard at all  Food Insecurity: No Food Insecurity (06/22/2024)   Epic    Worried About Programme Researcher, Broadcasting/film/video in the Last Year: Never true    Ran Out of Food in the Last Year: Never true  Transportation Needs: No Transportation Needs (06/22/2024)   Epic    Lack of Transportation (Medical): No    Lack of Transportation (Non-Medical): No   Physical Activity: Insufficiently Active (06/22/2024)   Exercise Vital Sign    Days of Exercise per Week: 4 days    Minutes of Exercise per Session: 30 min  Stress: Stress Concern Present (06/22/2024)   [employer] of Occupational Health - Occupational Stress Questionnaire    Feeling of Stress: To some extent  Social Connections: Unknown (06/22/2024)   Social Connection and Isolation Panel    Frequency of Communication with Friends and Family: Patient declined    Frequency of Social Gatherings with Friends and Family: Patient declined    Attends Religious Services: Patient declined    Database Administrator or Organizations: No    Attends Engineer, Structural: Not on file    Marital Status: Married  Depression (PHQ2-9): Low Risk (06/23/2024)   Depression (PHQ2-9)    PHQ-2 Score: 2  Alcohol Screen: Not on file  Housing: Unknown (06/22/2024)   Epic    Unable to Pay for Housing in the Last Year: No    Number of Times Moved in the Last Year: Not on file    Homeless in the Last Year: No  Utilities: Not on file  Health Literacy: Not on file    Review of Systems Per HPI  Objective:  BP 114/76 (BP Location: Left Arm, Patient Position: Sitting)  Pulse 68   Temp 98.1 F (36.7 C)   Ht 5' 11 (1.803 m)   Wt 214 lb 2 oz (97.1 kg)   SpO2 97%   BMI 29.86 kg/m      06/23/2024   11:05 AM 05/23/2024   10:35 AM 05/23/2024   10:30 AM  BP/Weight  Systolic BP 114 92 117  Diastolic BP 76 43 57  Wt. (Lbs) 214.13    BMI 29.86 kg/m2      Physical Exam Vitals and nursing note reviewed.  Constitutional:      General: He is not in acute distress.    Appearance: Normal appearance.  HENT:     Head: Normocephalic and atraumatic.  Eyes:     General:        Right eye: No discharge.        Left eye: No discharge.     Conjunctiva/sclera: Conjunctivae normal.  Cardiovascular:     Rate and Rhythm: Normal rate and regular rhythm.  Pulmonary:     Effort: Pulmonary  effort is normal.     Breath sounds: Normal breath sounds. No wheezing, rhonchi or rales.  Neurological:     Mental Status: He is alert.  Psychiatric:        Mood and Affect: Mood normal.        Behavior: Behavior normal.     Lab Results  Component Value Date   WBC 5.4 03/14/2021   HGB 13.7 03/14/2021   HCT 40.2 03/14/2021   PLT 175 03/14/2021   GLUCOSE 96 03/14/2021   CHOL 162 03/14/2021   TRIG 55 03/14/2021   HDL 40 03/14/2021   LDLCALC 111 (H) 03/14/2021   ALT 16 03/14/2021   AST 20 03/14/2021   NA 139 03/14/2021   K 4.2 03/14/2021   CL 105 03/14/2021   CREATININE 1.05 03/14/2021   BUN 20 03/14/2021   CO2 22 03/14/2021   TSH 1.78 11/28/2023     Assessment & Plan:  Primary insomnia Assessment & Plan: Stable. Trazodone refilled.    Generalized anxiety disorder Assessment & Plan: Stable. Meds refilled.  Orders: -     ALPRAZolam ; Take 1 tablet (0.5 mg total) by mouth 2 (two) times daily as needed for anxiety.  Dispense: 60 tablet; Refill: 3  Gastroesophageal reflux disease, unspecified whether esophagitis present Assessment & Plan: Stable. Omeprazole  refilled.  Orders: -     Omeprazole ; Take 1 capsule (20 mg total) by mouth daily.  Dispense: 90 capsule; Refill: 3  Other orders -     buPROPion  HCl ER (XL); Take 1 tablet (300 mg total) by mouth daily.  Dispense: 90 tablet; Refill: 3 -     traZODone HCl; Take 1.5 tablets (150 mg total) by mouth at bedtime.  Dispense: 90 tablet; Refill: 3 -     busPIRone HCl; Take 2 tablets (20 mg total) by mouth 3 (three) times daily.  Dispense: 540 tablet; Refill: 3    Follow-up:  6 months  Candis Kabel Bluford DO [facility]

## 2024-06-23 NOTE — Assessment & Plan Note (Signed)
Stable. Omeprazole refilled.  

## 2024-07-01 ENCOUNTER — Ambulatory Visit: Admitting: Internal Medicine

## 2024-07-28 ENCOUNTER — Telehealth: Admitting: Physician Assistant

## 2024-07-28 DIAGNOSIS — J019 Acute sinusitis, unspecified: Secondary | ICD-10-CM

## 2024-07-28 DIAGNOSIS — B9689 Other specified bacterial agents as the cause of diseases classified elsewhere: Secondary | ICD-10-CM | POA: Diagnosis not present

## 2024-07-28 MED ORDER — DOXYCYCLINE HYCLATE 100 MG PO TABS: 100.0000 mg | ORAL_TABLET | Freq: Two times a day (BID) | ORAL | 0 refills | Status: AC

## 2024-07-28 NOTE — Progress Notes (Signed)

## 2024-08-08 ENCOUNTER — Telehealth: Payer: Self-pay | Admitting: *Deleted

## 2024-08-08 NOTE — Telephone Encounter (Signed)
 Called to let him know we were opening late.  He is unsure what happened with his last RX for reflux he thinks protonix .  He would like to have this resent in please and then he will reschedule this appt.

## 2024-08-09 ENCOUNTER — Ambulatory Visit: Admitting: Family Medicine

## 2024-08-09 ENCOUNTER — Other Ambulatory Visit: Payer: Self-pay

## 2024-08-09 ENCOUNTER — Encounter: Payer: Self-pay | Admitting: Family Medicine

## 2024-08-09 ENCOUNTER — Ambulatory Visit (HOSPITAL_COMMUNITY)
Admission: RE | Admit: 2024-08-09 | Discharge: 2024-08-09 | Disposition: A | Discharge status: Home / Self Care | Source: Ambulatory Visit | Attending: Family Medicine | Admitting: Family Medicine

## 2024-08-09 ENCOUNTER — Ambulatory Visit: Payer: Self-pay | Admitting: Family Medicine

## 2024-08-09 ENCOUNTER — Ambulatory Visit: Admitting: Internal Medicine

## 2024-08-09 VITALS — BP 123/65 | HR 64 | Temp 97.9°F | Ht 71.0 in | Wt 214.0 lb

## 2024-08-09 DIAGNOSIS — R052 Subacute cough: Secondary | ICD-10-CM | POA: Insufficient documentation

## 2024-08-09 MED ORDER — PANTOPRAZOLE SODIUM 40 MG PO TBEC: 40.0000 mg | DELAYED_RELEASE_TABLET | Freq: Every day | ORAL | 11 refills | Status: AC

## 2024-08-09 MED ORDER — PREDNISONE 50 MG PO TABS: 50.0000 mg | ORAL_TABLET | Freq: Every day | ORAL | 0 refills | Status: AC

## 2024-08-09 MED ORDER — PROMETHAZINE-DM 6.25-15 MG/5ML PO SYRP: 5.0000 mL | ORAL_SOLUTION | Freq: Four times a day (QID) | ORAL | 0 refills | Status: AC | PRN

## 2024-08-09 NOTE — Progress Notes (Signed)
 "  Subjective:  Patient ID: Samuel Henry, male    DOB: 1961/01/25  Age: 64 y.o. MRN: [id number]  CC:   Chief Complaint  Patient presents with   Cough    07/14/24 pt starting having chest/head congestion with a cough and slight sore throat. Had a Evisit and received Doxycycline  which cleared up the head congestion but nothing else. C/O of a dry cough now and getting light winded when working.    HPI:  64 year old male presents for evaluation of the above.  Patient reports he has been sick since January 1.  He had an e-visit on 1/15 and was treated with doxycycline .  He states that his sinus symptoms have improved.  However, he continues to have cough and chest congestion.  Also has laryngitis.  No fever.  No other reported symptoms.  No other complaints.  Patient Active Problem List   Diagnosis Date Noted   Subacute cough 08/09/2024   Chronic bilateral low back pain without sciatica 03/26/2023   Hyperlipidemia 04/14/2022   Preventative health care 04/14/2022   History of migraine headaches 09/03/2013   S/P right knee arthroscopy 03/24/2013   Generalized anxiety disorder 10/08/2012   Insomnia 10/08/2012   GERD (gastroesophageal reflux disease) 10/01/2012    Social Hx   Social History   Socioeconomic History   Marital status: Married    Spouse name: Not on file   Number of children: Not on file   Years of education: Not on file   Highest education level: 12th grade  Occupational History   Not on file  Tobacco Use   Smoking status: Never   Smokeless tobacco: Never  Vaping Use   Vaping status: Never Used  Substance and Sexual Activity   Alcohol use: No   Drug use: No   Sexual activity: Yes    Birth control/protection: None  Other Topics Concern   Not on file  Social History Narrative   Not on file   Social Drivers of Health   Tobacco Use: Low Risk (08/09/2024)   Patient History    Smoking Tobacco Use: Never    Smokeless Tobacco Use: Never    Passive Exposure: Not  on file  Financial Resource Strain: Low Risk (06/22/2024)   Overall Financial Resource Strain (CARDIA)    Difficulty of Paying Living Expenses: Not hard at all  Food Insecurity: No Food Insecurity (06/22/2024)   Epic    Worried About Programme Researcher, Broadcasting/film/video in the Last Year: Never true    Ran Out of Food in the Last Year: Never true  Transportation Needs: No Transportation Needs (06/22/2024)   Epic    Lack of Transportation (Medical): No    Lack of Transportation (Non-Medical): No  Physical Activity: Insufficiently Active (06/22/2024)   Exercise Vital Sign    Days of Exercise per Week: 4 days    Minutes of Exercise per Session: 30 min  Stress: Stress Concern Present (06/22/2024)   [employer] of Occupational Health - Occupational Stress Questionnaire    Feeling of Stress: To some extent  Social Connections: Unknown (06/22/2024)   Social Connection and Isolation Panel    Frequency of Communication with Friends and Family: Patient declined    Frequency of Social Gatherings with Friends and Family: Patient declined    Attends Religious Services: Patient declined    Database Administrator or Organizations: No    Attends Engineer, Structural: Not on file    Marital Status: Married  Depression [phone]): Low Risk (  08/09/2024)   Depression (PHQ2-9)    PHQ-2 Score: 2  Alcohol Screen: Not on file  Housing: Unknown (06/22/2024)   Epic    Unable to Pay for Housing in the Last Year: No    Number of Times Moved in the Last Year: Not on file    Homeless in the Last Year: No  Utilities: Not on file  Health Literacy: Not on file    Review of Systems Per HPI  Objective:  BP 123/65   Pulse 64   Temp 97.9 F (36.6 C)   Ht 5' 11 (1.803 m)   Wt 214 lb (97.1 kg)   SpO2 99%   BMI 29.85 kg/m      08/09/2024   10:31 AM 06/23/2024   11:05 AM 05/23/2024   10:35 AM  BP/Weight  Systolic BP 123 114 92  Diastolic BP 65 76 43  Wt. (Lbs) 214 214.13   BMI 29.85 kg/m2 29.86 kg/m2      Physical Exam Vitals and nursing note reviewed.  Constitutional:      General: He is not in acute distress.    Appearance: Normal appearance.  HENT:     Head: Normocephalic and atraumatic.     Right Ear: Tympanic membrane normal.     Left Ear: Tympanic membrane normal.     Mouth/Throat:     Pharynx: Oropharynx is clear.  Cardiovascular:     Rate and Rhythm: Normal rate and regular rhythm.  Pulmonary:     Effort: Pulmonary effort is normal.     Breath sounds: Normal breath sounds. No wheezing, rhonchi or rales.  Neurological:     Mental Status: He is alert.     Lab Results  Component Value Date   WBC 5.4 03/14/2021   HGB 13.7 03/14/2021   HCT 40.2 03/14/2021   PLT 175 03/14/2021   GLUCOSE 96 03/14/2021   CHOL 162 03/14/2021   TRIG 55 03/14/2021   HDL 40 03/14/2021   LDLCALC 111 (H) 03/14/2021   ALT 16 03/14/2021   AST 20 03/14/2021   NA 139 03/14/2021   K 4.2 03/14/2021   CL 105 03/14/2021   CREATININE 1.05 03/14/2021   BUN 20 03/14/2021   CO2 22 03/14/2021   TSH 1.78 11/28/2023     Assessment & Plan:  Subacute cough Assessment & Plan: Chest x-ray for further evaluation.  Promethazine  DM for cough.  Placing on corticosteroid burst. Advised voice rest  Orders: -     DG Chest 2 View -     predniSONE ; Take 1 tablet (50 mg total) by mouth daily for 5 days.  Dispense: 5 tablet; Refill: 0 -     Promethazine -DM; Take 5 mLs by mouth 4 (four) times daily as needed for cough.  Dispense: 118 mL; Refill: 0    Follow-up:  Return if symptoms worsen or fail to improve.  Jacqulyn Ahle DO [facility] "

## 2024-08-09 NOTE — Assessment & Plan Note (Addendum)
 Chest x-ray for further evaluation.  Promethazine  DM for cough.  Placing on corticosteroid burst. Advised voice rest

## 2024-08-09 NOTE — Telephone Encounter (Signed)
Rx was sent to pharmacy, pt was made aware.

## 2024-08-09 NOTE — Patient Instructions (Signed)
 Medications as directed.  Voice rest.  Chest xray at the hospital.  We will call with results.  Take care  Dr. Bluford
